# Patient Record
Sex: Female | Born: 2000 | Hispanic: Yes | Smoking: Former smoker
Health system: Southern US, Community
[De-identification: ages and names within clinical notes are randomized; demographics above are authoritative.]

## PROBLEM LIST (undated history)

## (undated) DIAGNOSIS — O039 Complete or unspecified spontaneous abortion without complication: Secondary | ICD-10-CM

## (undated) DIAGNOSIS — R0602 Shortness of breath: Secondary | ICD-10-CM

## (undated) DIAGNOSIS — D649 Anemia, unspecified: Secondary | ICD-10-CM

## (undated) HISTORY — DX: Anemia, unspecified: D64.9

## (undated) HISTORY — DX: Complete or unspecified spontaneous abortion without complication: O03.9

## (undated) HISTORY — DX: Shortness of breath: R06.02

## (undated) HISTORY — PX: NO PAST SURGERIES: SHX2092

## (undated) HISTORY — PX: APPENDECTOMY: SHX54

---

## 2020-10-11 NOTE — L&D Delivery Note (Signed)
Vaginal Delivery Note  Spontaneous delivery of live viable female infant from the OA position through an intact perineum. Delivery of anterior right shoulder with gentle downward guidance followed by delivery of the left posterior shoulder with gentle upward guidance. Body followed spontaneously. Infant placed on maternal chest. Nursery present and helped with neonatal resuscitation and evaluation. Cord clamped and cut after one minute. Cord blood collected. Placenta delivered spontaneously and intact with a 3 vessel cord.  Second degree perineal laceration. Uterus firm and below umbilicus at the end of the delivery.  Mom and baby recovering in stable condition. Sponge and needle counts were correct at the end of the delivery. Methergine and cytotec were given for bleeding.   APGARS: 1 minute:7 5 minutes: 9 Weight: pending Epidural present Laceration: Second degree perineal / right labial  Adelene Idler MD [facility], [facility] 06/23/21 11:37 PM

## 2020-10-16 ENCOUNTER — Emergency Department: Payer: Medicaid Other

## 2020-10-16 ENCOUNTER — Encounter: Payer: Self-pay | Admitting: Emergency Medicine

## 2020-10-16 ENCOUNTER — Other Ambulatory Visit: Payer: Self-pay

## 2020-10-16 ENCOUNTER — Emergency Department
Admission: EM | Admit: 2020-10-16 | Discharge: 2020-10-16 | Disposition: A | Discharge status: Home / Self Care | Payer: Medicaid Other | Attending: Emergency Medicine | Admitting: Emergency Medicine

## 2020-10-16 DIAGNOSIS — R1030 Lower abdominal pain, unspecified: Secondary | ICD-10-CM | POA: Insufficient documentation

## 2020-10-16 DIAGNOSIS — Z3491 Encounter for supervision of normal pregnancy, unspecified, first trimester: Secondary | ICD-10-CM

## 2020-10-16 DIAGNOSIS — O26891 Other specified pregnancy related conditions, first trimester: Secondary | ICD-10-CM | POA: Insufficient documentation

## 2020-10-16 DIAGNOSIS — Z3A01 Less than 8 weeks gestation of pregnancy: Secondary | ICD-10-CM | POA: Diagnosis not present

## 2020-10-16 LAB — ABO/RH: ABO/RH(D): A POS

## 2020-10-16 LAB — URINALYSIS, COMPLETE (UACMP) WITH MICROSCOPIC
Bacteria, UA: NONE SEEN
Bilirubin Urine: NEGATIVE
Glucose, UA: NEGATIVE mg/dL
Hgb urine dipstick: NEGATIVE
Ketones, ur: NEGATIVE mg/dL
Leukocytes,Ua: NEGATIVE
Nitrite: NEGATIVE
Protein, ur: NEGATIVE mg/dL
Specific Gravity, Urine: 1.014 (ref 1.005–1.030)
pH: 7 (ref 5.0–8.0)

## 2020-10-16 LAB — HCG, QUANTITATIVE, PREGNANCY: hCG, Beta Chain, Quant, S: 5167 m[IU]/mL — ABNORMAL HIGH

## 2020-10-16 MED ORDER — ACETAMINOPHEN 500 MG PO TABS
1000.0000 mg | ORAL_TABLET | ORAL | Status: AC
  Administered 2020-10-16: 1000 mg via ORAL
  Filled 2020-10-16: qty 2

## 2020-10-16 NOTE — ED Notes (Signed)
Interpreter requested 

## 2020-10-16 NOTE — ED Notes (Signed)
Pt taken to US

## 2020-10-16 NOTE — ED Triage Notes (Signed)
Pt to ED via POV with c/o possible pregnancy. Pt states having lower abdominal cramping, denies vaginal bleeding, pt states +home pregnancy test 2 days ago.

## 2020-10-16 NOTE — ED Provider Notes (Signed)
[facility] Emergency Department Provider Note   ____________________________________________   Event Date/Time   First MD Initiated Contact with Patient 10/16/20 1315     (approximate)  I have reviewed the triage vital signs and the nursing notes.   HISTORY  Chief Complaint Possible Pregnancy  Spanish interpreter Annice Pih utilized  HPI Nicole Rocha is a 20 y.o. female   here for evaluation for possible pregnancy  Patient reports she has had slight to moderate lower abdominal crampy discomfort for about 2 days.  She is also noticed over the last few days that she has had breast tenderness.  Denies vaginal bleeding or discharge.  Has not taken anything to alleviate symptoms.  Took a home pregnancy test that was positive  No fevers or chills.  No nausea vomiting.  No chest pain or trouble breathing.  Denies pain or burning with urination.  If pregnant this would be her first pregnancy   History reviewed. No pertinent past medical history.  There are no problems to display for this patient.   History reviewed. No pertinent surgical history.  Prior to Admission medications   Not on File  Takes no medications no allergies to any medications  Allergies Patient has no known allergies.  No family history on file.  Social History Social History   Tobacco Use  . Smoking status: Never Smoker  . Smokeless tobacco: Never Used  Substance Use Topics  . Alcohol use: Yes    Comment: occ  . Drug use: Not Currently    Review of Systems Constitutional: No fever/chills ENT: No sore throat. Cardiovascular: Denies chest pain. Respiratory: Denies shortness of breath. Gastrointestinal: Crampy lower abdominal pain.  Also associated to loose stools today Genitourinary: Negative for dysuria. Musculoskeletal: Negative for back pain. Skin: Negative for rash. Neurological: Negative for headaches, areas of focal weakness or  numbness.    ____________________________________________   PHYSICAL EXAM:  VITAL SIGNS: ED Triage Vitals  Enc Vitals Group     BP 10/16/20 1252 109/67     Pulse Rate 10/16/20 1252 77     Resp 10/16/20 1252 18     Temp 10/16/20 1252 98.8 F (37.1 C)     Temp Source 10/16/20 1252 Oral     SpO2 10/16/20 1252 100 %     Weight 10/16/20 1253 160 lb (72.6 kg)     Height 10/16/20 1253 5\' 6"  (1.676 m)     Head Circumference --      Peak Flow --      Pain Score 10/16/20 1253 5     Pain Loc --      Pain Edu? --      Excl. in GC? --     Constitutional: Alert and oriented. Well appearing and in no acute distress. Eyes: Conjunctivae are normal. Head: Atraumatic. Nose: No congestion/rhinnorhea. Mouth/Throat: Mucous membranes are moist. Neck: No stridor.  Cardiovascular: Normal rate, regular rhythm. Grossly normal heart sounds.  Good peripheral circulation. Respiratory: Normal respiratory effort.  No retractions. Lungs CTAB. Gastrointestinal: Soft and mild tenderness primarily in the suprapubic region.  No focal pain at McBurney's point.  Negative Murphy.  Reports pain is about a 6 out of 10 when examined more in the lower abdomen.  No rebound or guarding. overall reassuring abdominal examination. Musculoskeletal: No lower extremity tenderness nor edema. Neurologic:  Normal speech and language. No gross focal neurologic deficits are appreciated.  Ambulates with normal gait without difficulty. Skin:  Skin is warm, dry and intact. No rash  noted. Psychiatric: Mood and affect are normal. Speech and behavior are normal.  ____________________________________________   LABS (all labs ordered are listed, but only abnormal results are displayed)  Labs Reviewed  HCG, QUANTITATIVE, PREGNANCY - Abnormal; Notable for the following components:      Result Value   hCG, Beta Chain, Quant, S 5,167 (*)    All other components within normal limits  URINALYSIS, COMPLETE (UACMP) WITH MICROSCOPIC -  Abnormal; Notable for the following components:   Color, Urine YELLOW (*)    APPearance CLEAR (*)    All other components within normal limits  URINE CULTURE  ABO/RH   ____________________________________________  EKG   ____________________________________________  RADIOLOGY  IMPRESSION: Probable early intrauterine gestational sac and yolk sac, but no fetal pole, or cardiac activity yet visualized. Recommend follow-up quantitative B-HCG levels and follow-up US in 14 days to assess viability. This recommendation follows SRU consensus guidelines: Diagnostic Criteria for Nonviable Pregnancy Early in the First Trimester. Malva Limes Med 2013; 081:4481-85.  Ultrasound results reviewed by me, probable early intrauterine gestational sac ____________________________________________   PROCEDURES  Procedure(s) performed: None  Procedures  Critical Care performed: No  ____________________________________________   INITIAL IMPRESSION / ASSESSMENT AND PLAN / ED COURSE  Pertinent labs & imaging results that were available during my care of the patient were reviewed by me and considered in my medical decision making (see chart for details).   Patient with positive hCG greater than 5000.  Given her symptoms associated breast tenderness and positive pregnancy test will evaluate with ultrasound and urinalysis.  Denies acute gynecologic infectious symptoms.  Her exam is nontoxic.  At this point with her reassuring exam afebrile status and nonfocality of pain in the area of McBurney's point or right upper quadrant I do not feel that imaging such as CT imaging of the abdomen would be warranted.  Reassuring examination and nontoxic appearance.  ----------------------------------------- 3:33 PM on 10/16/2020 -----------------------------------------  Discussed test results and recommendations for follow-up with patient via Spanish interpreter.  Discussed careful return precautions.  Patient  does report she is vacationing in the area, so we did discuss very careful return precautions as well as a follow-up option for her to follow-up with [facility].  Patient understands that she is pregnant and will start taking prenatal vitamin as well daily and set up follow-up with obstetrician        ____________________________________________   FINAL CLINICAL IMPRESSION(S) / ED DIAGNOSES  Final diagnoses:  First trimester pregnancy        Note:  This document was prepared using Dragon voice recognition software and may include unintentional dictation errors       Sharyn Creamer, MD 10/16/20 1534

## 2020-10-18 LAB — URINE CULTURE: Culture: <10000 — AB

## 2020-11-15 ENCOUNTER — Encounter: Payer: Self-pay | Admitting: Emergency Medicine

## 2020-11-15 DIAGNOSIS — O26891 Other specified pregnancy related conditions, first trimester: Secondary | ICD-10-CM | POA: Diagnosis present

## 2020-11-15 DIAGNOSIS — Z3A09 9 weeks gestation of pregnancy: Secondary | ICD-10-CM | POA: Diagnosis not present

## 2020-11-15 DIAGNOSIS — O23591 Infection of other part of genital tract in pregnancy, first trimester: Secondary | ICD-10-CM | POA: Insufficient documentation

## 2020-11-15 DIAGNOSIS — B9689 Other specified bacterial agents as the cause of diseases classified elsewhere: Secondary | ICD-10-CM | POA: Diagnosis not present

## 2020-11-15 LAB — COMPREHENSIVE METABOLIC PANEL
ALT: 18 U/L (ref 0–44)
AST: 22 U/L (ref 15–41)
Albumin: 4.5 g/dL (ref 3.5–5.0)
Alkaline Phosphatase: 50 U/L (ref 38–126)
Anion gap: 11 (ref 5–15)
BUN: 15 mg/dL (ref 6–20)
CO2: 21 mmol/L — ABNORMAL LOW (ref 22–32)
Calcium: 9.5 mg/dL (ref 8.9–10.3)
Chloride: 104 mmol/L (ref 98–111)
Creatinine, Ser: 0.37 mg/dL — ABNORMAL LOW (ref 0.44–1.00)
GFR, Estimated: >60 mL/min (ref >60)
Glucose, Bld: 117 mg/dL — ABNORMAL HIGH (ref 70–99)
Potassium: 3.4 mmol/L — ABNORMAL LOW (ref 3.5–5.1)
Sodium: 136 mmol/L (ref 135–145)
Total Bilirubin: 0.3 mg/dL (ref 0.3–1.2)
Total Protein: 7.7 g/dL (ref 6.5–8.1)

## 2020-11-15 LAB — CBC
HCT: 33.6 % — ABNORMAL LOW (ref 36.0–46.0)
Hemoglobin: 11.7 g/dL — ABNORMAL LOW (ref 12.0–15.0)
MCH: 28.6 pg (ref 26.0–34.0)
MCHC: 34.8 g/dL (ref 30.0–36.0)
MCV: 82.2 fL (ref 80.0–100.0)
Platelets: 280 10*3/uL (ref 150–400)
RBC: 4.09 MIL/uL (ref 3.87–5.11)
RDW: 12.7 % (ref 11.5–15.5)
WBC: 8.8 10*3/uL (ref 4.0–10.5)
nRBC: 0 % (ref 0.0–0.2)

## 2020-11-15 LAB — URINALYSIS, COMPLETE (UACMP) WITH MICROSCOPIC
Bacteria, UA: NONE SEEN
Bilirubin Urine: NEGATIVE
Glucose, UA: NEGATIVE mg/dL
Hgb urine dipstick: NEGATIVE
Ketones, ur: NEGATIVE mg/dL
Leukocytes,Ua: NEGATIVE
Nitrite: NEGATIVE
Protein, ur: NEGATIVE mg/dL
Specific Gravity, Urine: 1.024 (ref 1.005–1.030)
pH: 5 (ref 5.0–8.0)

## 2020-11-15 LAB — LIPASE, BLOOD: Lipase: 36 U/L (ref 11–51)

## 2020-11-15 NOTE — ED Triage Notes (Signed)
Interpreter [phone]  Pt reports she is [redacted] weeks pregnant and started to have lower abdominal cramping. Pt denies vaginal bleeding. Pt has been pregnant x2 with 1 miscarriage.

## 2020-11-16 ENCOUNTER — Emergency Department
Admission: EM | Admit: 2020-11-16 | Discharge: 2020-11-16 | Disposition: A | Discharge status: Home / Self Care | Payer: Medicaid Other | Attending: Emergency Medicine | Admitting: Emergency Medicine

## 2020-11-16 ENCOUNTER — Emergency Department: Payer: Medicaid Other

## 2020-11-16 DIAGNOSIS — O26899 Other specified pregnancy related conditions, unspecified trimester: Secondary | ICD-10-CM

## 2020-11-16 DIAGNOSIS — R102 Pelvic and perineal pain: Secondary | ICD-10-CM

## 2020-11-16 DIAGNOSIS — B9689 Other specified bacterial agents as the cause of diseases classified elsewhere: Secondary | ICD-10-CM

## 2020-11-16 LAB — CHLAMYDIA/NGC RT PCR (ARMC ONLY)
Chlamydia Tr: NOT DETECTED
N gonorrhoeae: NOT DETECTED

## 2020-11-16 LAB — WET PREP, GENITAL
Sperm: NONE SEEN
Trich, Wet Prep: NONE SEEN
WBC, Wet Prep HPF POC: NONE SEEN
Yeast Wet Prep HPF POC: NONE SEEN

## 2020-11-16 LAB — HCG, QUANTITATIVE, PREGNANCY: hCG, Beta Chain, Quant, S: 230959 m[IU]/mL — ABNORMAL HIGH (ref <5)

## 2020-11-16 MED ORDER — METRONIDAZOLE 0.75 % VA GEL: 1.0000 | Freq: Two times a day (BID) | VAGINAL | 0 refills | Status: AC

## 2020-11-16 MED ORDER — ACETAMINOPHEN 500 MG PO TABS
1000.0000 mg | ORAL_TABLET | Freq: Once | ORAL | Status: AC
  Administered 2020-11-16: 1000 mg via ORAL
  Filled 2020-11-16: qty 2

## 2020-11-16 MED ORDER — METOCLOPRAMIDE HCL 10 MG PO TABS: 10.0000 mg | ORAL_TABLET | Freq: Three times a day (TID) | ORAL | 1 refills | Status: DC | PRN

## 2020-11-16 NOTE — ED Provider Notes (Addendum)
[facility] Emergency Department Provider Note  ____________________________________________   Event Date/Time   First MD Initiated Contact with Patient 11/16/20 0112     (approximate)  I have reviewed the triage vital signs and the nursing notes.   HISTORY  Chief Complaint Abdominal Pain    HPI Nicole Rocha is a 20 y.o. female G2P0 with h/o miscarriage who presents to the ED with complaints of lower abdominal pain since 6pm that feels like colic.  No A/A factors.  No medications taken prior to arrival.  No fever, chills.  Has had nausea, vomiting throughout pregnancy. Last emesis today. Intermittent diarrhea.  Last diarrhea.  No dysuria, hematuria, vaginal bleeding, no leaking fluid.  Has had foul smelling vaginal discharge intermittently for 3-4 months.  No h/o STDs.  Sexually active with one partner.  Has not seen OBGYN yet.  Has first appt with OB 2/16.  Can't remember name of OBGYN. LMP was 09/04/20.   Interpreter used throughout visit.        History reviewed. No pertinent past medical history.  There are no problems to display for this patient.   History reviewed. No pertinent surgical history.  Prior to Admission medications   Medication Sig Start Date End Date Taking? Authorizing Provider  metoCLOPramide (REGLAN) 10 MG tablet Take 1 tablet (10 mg total) by mouth every 8 (eight) hours as needed for nausea. 11/16/20 11/16/21 Yes Delila Kuklinski N, DO  metroNIDAZOLE (METROGEL VAGINAL) 0.75 % vaginal gel Place 1 Applicatorful vaginally 2 (two) times daily for 7 days. 11/16/20 11/23/20 Yes Shelisa Fern, Layla Maw, DO    Allergies Patient has no known allergies.  History reviewed. No pertinent family history.  Social History Social History   Tobacco Use  . Smoking status: Never Smoker  . Smokeless tobacco: Never Used  Substance Use Topics  . Alcohol use: Yes    Comment: occ  . Drug use: Not Currently    Review of Systems Constitutional:  No fever. Eyes: No visual changes. ENT: No sore throat. Cardiovascular: Denies chest pain. Respiratory: Denies shortness of breath. Gastrointestinal: + nausea, vomiting, diarrhea. Genitourinary: Negative for dysuria. Musculoskeletal: Negative for back pain. Skin: Negative for rash. Neurological: Negative for focal weakness or numbness.  ____________________________________________   PHYSICAL EXAM:  VITAL SIGNS: ED Triage Vitals [11/15/20 2323]  Enc Vitals Group     BP 129/73     Pulse Rate 94     Resp 17     Temp 98.7 F (37.1 C)     Temp Source Oral     SpO2 99 %     Weight      Height      Head Circumference      Peak Flow      Pain Score      Pain Loc      Pain Edu?      Excl. in GC?    CONSTITUTIONAL: Alert and oriented and responds appropriately to questions. Well-appearing; well-nourished HEAD: Normocephalic EYES: Conjunctivae clear, pupils appear equal, EOM appear intact ENT: normal nose; moist mucous membranes NECK: Supple, normal ROM CARD: RRR; S1 and S2 appreciated; no murmurs, no clicks, no rubs, no gallops RESP: Normal chest excursion without splinting or tachypnea; breath sounds clear and equal bilaterally; no wheezes, no rhonchi, no rales, no hypoxia or respiratory distress, speaking full sentences ABD/GI: Normal bowel sounds; non-distended; soft, mild suprapubic tenderness, no rebound, no guarding, no peritoneal signs, no hepatosplenomegaly GU:  Normal external genitalia. No lesions, rashes noted.  Patient has no vaginal bleeding on exam.  Minimal thin white vaginal discharge.  No adnexal tenderness, mass or fullness, no cervical motion tenderness. Cervix is not appear friable.  Cervix is closed.  Chaperone present for exam. BACK: The back appears normal EXT: Normal ROM in all joints; no deformity noted, no edema; no cyanosis SKIN: Normal color for age and race; warm; no rash on exposed skin NEURO: Moves all extremities equally PSYCH: The patient's mood  and manner are appropriate.  ____________________________________________   LABS (all labs ordered are listed, but only abnormal results are displayed)  Labs Reviewed  WET PREP, GENITAL - Abnormal; Notable for the following components:      Result Value   Clue Cells Wet Prep HPF POC PRESENT (*)    All other components within normal limits  COMPREHENSIVE METABOLIC PANEL - Abnormal; Notable for the following components:   Potassium 3.4 (*)    CO2 21 (*)    Glucose, Bld 117 (*)    Creatinine, Ser 0.37 (*)    All other components within normal limits  CBC - Abnormal; Notable for the following components:   Hemoglobin 11.7 (*)    HCT 33.6 (*)    All other components within normal limits  URINALYSIS, COMPLETE (UACMP) WITH MICROSCOPIC - Abnormal; Notable for the following components:   Color, Urine YELLOW (*)    APPearance HAZY (*)    All other components within normal limits  HCG, QUANTITATIVE, PREGNANCY - Abnormal; Notable for the following components:   hCG, Beta Chain, Quant, S 230,959 (*)    All other components within normal limits  CHLAMYDIA/NGC RT PCR ([facility] ONLY)  LIPASE, BLOOD   ____________________________________________  EKG  none ____________________________________________  RADIOLOGY I, Demetre Monaco, personally viewed and evaluated these images (plain radiographs) as part of my medical decision making, as well as reviewing the written report by the radiologist.  ED MD interpretation:  Normal IUP with normal FHR  Official radiology report(s): US OB Comp Less 14 Wks  Result Date: 11/16/2020 CLINICAL DATA:  Cramping. Beta HCG quantitative 230,959 (from 5,167 on 10/16/20). Last menstrual period 09/04/2020. Gestational age by last menstrual period 10 weeks and 3 days. Due date by last menstrual period 06/11/2021. EXAM: OBSTETRIC <14 WK [place] AND TRANSVAGINAL OB US TECHNIQUE: Both transabdominal and transvaginal ultrasound examinations were performed for complete  evaluation of the gestation as well as the maternal uterus, adnexal regions, and pelvic cul-de-sac. Transvaginal technique was performed to assess early pregnancy. COMPARISON:  Ultrasound Ob 10/16/2020 FINDINGS: Intrauterine gestational sac: Single Yolk sac:  Visualized. Embryo:  Visualized. Cardiac Activity: Visualized. Heart Rate: 176 bpm CRL:  27.3 mm   9 w   4 d                  [place] EDC: 06/17/2021 Subchorionic hemorrhage:  None visualized. Maternal uterus/adnexae: The right ovary is unremarkable. The right ovary measures 4.1 x 1.7 x 1.7 cm. The left ovary is unremarkable and measures 3.1 x 1.8 x 2.1 cm. Corpus luteum cyst noted within the left ovary. Other: No free pelvic fluid. IMPRESSION: Single live intrauterine pregnancy with gestational age of [redacted] weeks and 4 days that is concordant with gestational age by last menstrual period of 10 weeks and 3 days. Electronically Signed   By: Tish Frederickson M.D.   On: 11/16/2020 00:44    ____________________________________________   PROCEDURES  Procedure(s) performed (including Critical Care):   ____________________________________________   INITIAL IMPRESSION / ASSESSMENT AND PLAN / ED COURSE  As part of my medical decision making, I reviewed the following data within the electronic MEDICAL RECORD NUMBER Nursing notes reviewed and incorporated, Interpreter needed, Labs reviewed, Old chart reviewed and Notes from prior ED visits         Patient here with lower abdominal pain in the setting of pregnancy.  Reports intermittent foul-smelling discharge.  Will perform pelvic exam with cultures.  No right lower quadrant tenderness, fever, leukocytosis.  Low suspicion for appendicitis, colitis, diverticulitis, kidney stone, pyelonephritis.  Urine does not appear infected here and there is no protein, ketones.  Abdominal ultrasound obtained shows single live intrauterine pregnancy measuring approximately 9 weeks and 4 days with fetal heart rate of 176.  She  denies any vaginal bleeding.  She has follow-up with OB/GYN scheduled in 10 days.  Will give Tylenol for discomfort and reassess.     Prep Positive for clue cells.  Will discharge on MetroGel.  Gonorrhea and Chlamydia negative.  Will discharge home.  She has OB/GYN follow-up scheduled.  Recommended increase fluid intake and Tylenol over-the-counter for discomfort.  She is comfortable with this plan.  Able to tolerate po here.  Reports feeling better.  Patient request prescription for nausea medicine.  Will discharge with Reglan.   At this time, I do not feel there is any life-threatening condition present. I have reviewed, interpreted and discussed all results (EKG, imaging, lab, urine as appropriate) and exam findings with patient/family. I have reviewed nursing notes and appropriate previous records.  I feel the patient is safe to be discharged home without further emergent workup and can continue workup as an outpatient as needed. Discussed usual and customary return precautions. Patient/family verbalize understanding and are comfortable with this plan.  Outpatient follow-up has been provided as needed. All questions have been answered.  ____________________________________________   FINAL CLINICAL IMPRESSION(S) / ED DIAGNOSES  Final diagnoses:  Pelvic pain in pregnancy  Bacterial vaginosis in pregnancy     ED Discharge Orders         Ordered    metroNIDAZOLE (METROGEL VAGINAL) 0.75 % vaginal gel  2 times daily        11/16/20 0341    metoCLOPramide (REGLAN) 10 MG tablet  Every 8 hours PRN        11/16/20 0400          *Please note:  Nicole Rocha was evaluated in Emergency Department on 11/16/2020 for the symptoms described in the history of present illness. She was evaluated in the context of the global COVID-19 pandemic, which necessitated consideration that the patient might be at risk for infection with the SARS-CoV-2 virus that causes COVID-19. Institutional protocols  and algorithms that pertain to the evaluation of patients at risk for COVID-19 are in a state of rapid change based on information released by regulatory bodies including the CDC and federal and state organizations. These policies and algorithms were followed during the patient's care in the ED.  Some ED evaluations and interventions may be delayed as a result of limited staffing during and the pandemic.*   Note:  This document was prepared using Dragon voice recognition software and may include unintentional dictation errors.       Akane Tessier, Layla Maw, DO 11/16/20 0400

## 2020-11-16 NOTE — Discharge Instructions (Addendum)
Please follow-up with your OB/GYN as scheduled on the 16th.  You may take over-the-counter Tylenol 1000 mg every 6 hours as needed for pain.  Please increase your fluid intake.  Your pelvic swabs did show clue cells and we are starting you on antibiotics for bacterial vaginosis.  No other sign of infection noted.  This is not a sexually transmitted disease.  Otherwise your labs and urine were reassuring.  Ultrasound showed a normal-appearing pregnancy.

## 2020-11-16 NOTE — ED Notes (Signed)
Pt discharged with assist of Lavonda Jumbo interpreter [phone] with [facility] interpreter services.

## 2020-11-26 ENCOUNTER — Ambulatory Visit: Payer: Medicaid Other | Admitting: Family Medicine

## 2020-11-26 ENCOUNTER — Encounter: Payer: Self-pay | Admitting: Family Medicine

## 2020-11-26 ENCOUNTER — Other Ambulatory Visit: Payer: Self-pay

## 2020-11-26 VITALS — BP 103/68 | HR 84 | Temp 97.7°F | Wt 178.6 lb

## 2020-11-26 DIAGNOSIS — Z3480 Encounter for supervision of other normal pregnancy, unspecified trimester: Secondary | ICD-10-CM | POA: Insufficient documentation

## 2020-11-26 DIAGNOSIS — Z3481 Encounter for supervision of other normal pregnancy, first trimester: Secondary | ICD-10-CM | POA: Diagnosis not present

## 2020-11-26 DIAGNOSIS — Z01419 Encounter for gynecological examination (general) (routine) without abnormal findings: Secondary | ICD-10-CM

## 2020-11-26 DIAGNOSIS — Z1272 Encounter for screening for malignant neoplasm of vagina: Secondary | ICD-10-CM

## 2020-11-26 HISTORY — DX: Encounter for supervision of other normal pregnancy, unspecified trimester: Z34.80

## 2020-11-26 LAB — URINALYSIS
Bilirubin, UA: NEGATIVE
Glucose, UA: NEGATIVE
Ketones, UA: NEGATIVE
Leukocytes,UA: NEGATIVE
Nitrite, UA: NEGATIVE
Protein,UA: NEGATIVE
Specific Gravity, UA: 1.03 (ref 1.005–1.030)
Urobilinogen, Ur: 0.2 mg/dL (ref 0.2–1.0)
pH, UA: 6 (ref 5.0–7.5)

## 2020-11-26 LAB — WET PREP FOR TRICH, YEAST, CLUE
Trichomonas Exam: NEGATIVE
Yeast Exam: NEGATIVE

## 2020-11-26 LAB — HEMOGLOBIN, FINGERSTICK: Hemoglobin: 11.4 g/dL (ref 11.1–15.9)

## 2020-11-26 LAB — OB RESULTS CONSOLE VARICELLA ZOSTER ANTIBODY, IGG: Varicella: IMMUNE

## 2020-11-26 NOTE — Progress Notes (Addendum)
Here today for MH IP ~ 11.6 weeks. Taking PNV QD. Has been to [facility] ED on 10/16/2020 and 11/16/2020. Declines FLU and Covid vaccines today. Tawny Hopping, RN

## 2020-11-26 NOTE — Progress Notes (Signed)
[facility] HEALTH DEPT [facility] [address] [place] [address] [phone]  INITIAL PRENATAL VISIT NOTE  Subjective:  Nicole Rocha is a 20 y.o. G2P0010 at [redacted]w[redacted]d being seen today to start prenatal care at the [facility] Department.  She is currently monitored for the following issues for this low-risk pregnancy and has Supervision of other normal pregnancy, antepartum on their problem list.  Patient reports nausea.  Contractions: Not present. Vag. Bleeding: None.  Movement: Absent. Denies leaking of fluid.   Indications for ASA therapy (per uptodate) One of the following: Previous pregnancy with preeclampsia, especially early onset and with an adverse outcome No Multifetal gestation No Chronic hypertension No Type 1 or 2 diabetes mellitus No Chronic kidney disease No Autoimmune disease (antiphospholipid syndrome, systemic lupus erythematosus) No  Two or more of the following: Nulliparity No Obesity (body mass index >30 kg/m2) No Family history of preeclampsia in mother or sister No Age ?35 years No Sociodemographic characteristics (African American race, low socioeconomic level) Yes Personal risk factors (eg, previous pregnancy with low birth weight or small for gestational age infant, previous adverse pregnancy outcome [eg, stillbirth], interval >10 years between pregnancies) No   The following portions of the patient's history were reviewed and updated as appropriate: allergies, current medications, past family history, past medical history, past social history, past surgical history and problem list. Problem list updated.  Objective:   Vitals:   11/26/20 1328  BP: 103/68  Pulse: 84  Temp: 97.7 F (36.5 C)  Weight: 178 lb 9.6 oz (81 kg)    Fetal Status:   Fundal Height: 11 cm Movement: Absent  Presentation: Undeterminable   Physical Exam Vitals and nursing note reviewed.  Constitutional:       General: She is not in acute distress.    Appearance: Normal appearance. She is well-developed.  HENT:     Head: Normocephalic and atraumatic.     Right Ear: External ear normal.     Left Ear: External ear normal.     Nose: Nose normal. No congestion or rhinorrhea.     Mouth/Throat:     Lips: Pink.     Mouth: Mucous membranes are moist.     Dentition: Normal dentition. No dental caries.     Pharynx: Oropharynx is clear. Uvula midline.  Eyes:     General: No scleral icterus.    Conjunctiva/sclera: Conjunctivae normal.  Neck:     Thyroid: No thyroid mass or thyromegaly.  Cardiovascular:     Rate and Rhythm: Normal rate.     Pulses: Normal pulses.     Comments: Extremities are warm and well perfused Pulmonary:     Effort: Pulmonary effort is normal.     Breath sounds: Normal breath sounds.  Chest:     Chest wall: No mass.  Breasts:     Tanner Score is 5. Breasts are symmetrical.     Right: Normal. No mass, nipple discharge, skin change or axillary adenopathy.     Left: Normal. No mass, nipple discharge, skin change or axillary adenopathy.    Abdominal:     General: Abdomen is flat.     Palpations: Abdomen is soft.     Tenderness: There is no abdominal tenderness.     Comments: Gravid   Genitourinary:    General: Normal vulva.     Exam position: Lithotomy position.     Pubic Area: No rash.      Labia:  Right: No rash.        Left: No rash.      Vagina: Normal. No vaginal discharge.     Cervix: No cervical motion tenderness or friability.     Uterus: Normal. Not tender. Enlarged: Gravid 11 size       Adnexa: Right adnexa normal and left adnexa normal.     Rectum: Normal. No external hemorrhoid.  Musculoskeletal:     Right lower leg: No edema.     Left lower leg: No edema.  Lymphadenopathy:     Upper Body:     Right upper body: No axillary adenopathy.     Left upper body: No axillary adenopathy.  Skin:    General: Skin is warm.     Capillary Refill:  Capillary refill takes less than 2 seconds.  Neurological:     Mental Status: She is alert.     Assessment and Plan:  Pregnancy: G2P0010 at [redacted]w[redacted]d  1. Supervision of other normal pregnancy, antepartum 2. Smear, vaginal, as part of routine gynecological examination  Patient in clinic today for initial prenatal visit.  Patient is happy and wants to be pregnant.  Patient has previous SAB @ 16 weeks.  She lives in [place] with FOB.  She is not currently working.    Patient reports nausea mostly in the mornings.  She was given Reglan at her last ER visit on 11/16/2020 and reports that she only took 3 doses.  Patient given informations sheet on methods to help prevent or ease nausea.  She has had 2 ED visits on 10/16/2020 & 11/16/2020.  She denies bleeding or contractions.  Discussed with patient about s/sx of SAB including s/sx that she previously experienced.     Patient reports LMP was  09/04/2020. Patient is taking PNV daily.     She denies allergies, history of chronic medical illnesses, STI's , physical or sexual abuse.   Patient was tested for CT/GC on 11/16/2020, that resulted negative,  Patient denies any symptoms or new partners since testing.  Patient was treated for BV at [facility] on 11/16/2020.  Patient finished last dose of Metronidazole on 11/25/2020.   No previous pap for patient, PAP collected today.  Does not meet criteria for asa therapy nor MNT.  PHQ-9 is 4 and declines counseling.    wants to deliver with [facility].    Last ETOH used was 2 months ago, denies any tobacco or illegal substances used.     Last dentist: > 3-4 years ago   Hx preterm birth? (want 17p?)  SAB @ 3-4 months   Fhx genetic abn?  None  Discuss genetic tests: yes   Delivery group is [facility]   Covid vaccine: declines   Discussed overview of care and coordination with inpatient delivery practices including [facility], [facility], [facility] and [facility].     Preterm labor symptoms and general obstetric precautions  including but not limited to vaginal bleeding, contractions, leaking of fluid and fetal movement were reviewed in detail with the patient.  Please refer to After Visit Summary for other counseling recommendations.   Return in about 4 weeks (around 12/24/2020) for routine prenatal care.  Future Appointments  Date Time Provider Department Center  12/04/2020  9:00 AM [facility] GENETIC [facility] None   Spanish interpreter M. Yemen used  Wendi Snipes, [place]

## 2020-11-26 NOTE — Progress Notes (Signed)
Wet Prep results reviewed. Per standing orders no treatment indicated. Tawny Hopping, RN

## 2020-11-27 ENCOUNTER — Telehealth: Payer: Self-pay

## 2020-11-27 LAB — LEAD, BLOOD (ADULT >= 16 YRS): Lead-Whole Blood: <1 ug/dL (ref 0–4)

## 2020-11-27 NOTE — Telephone Encounter (Signed)
Per Epic appt desk, client has 12/04/20 0900 [facility] genetic counseling appt (has prior US). Call to client with Marlene Yemen ([facility] interpreter) to verify client aware of appt. Phone number not working and note on pink sticky to verify phone number. Left message on emergency contact number ([phone]) requesting assistance in contacting client to call [facility] and number provided. Jossie Ng, RN

## 2020-11-27 NOTE — Telephone Encounter (Signed)
Call received from client who was given genetic counseling appt information and directions to facility. Emmaline Kluver interpreted during call. Jossie Ng, RN

## 2020-11-28 LAB — 789231 7+OXYCODONE-BUND
Amphetamines, Urine: NEGATIVE ng/mL
BENZODIAZ UR QL: NEGATIVE ng/mL
Barbiturate screen, urine: NEGATIVE ng/mL
Cannabinoid Quant, Ur: NEGATIVE ng/mL
Cocaine (Metab.): NEGATIVE ng/mL
OPIATE SCREEN URINE: NEGATIVE ng/mL
Oxycodone/Oxymorphone, Urine: NEGATIVE ng/mL
PCP Quant, Ur: NEGATIVE ng/mL

## 2020-11-28 LAB — HGB FRACTIONATION CASCADE
Hgb A2: 2.7 % (ref 1.8–3.2)
Hgb A: 97.3 % (ref 96.4–98.8)
Hgb F: 0 % (ref 0.0–2.0)
Hgb S: 0 %

## 2020-11-28 LAB — HCV AB W REFLEX TO QUANT PCR: HCV Ab: 0.1 s/co ratio (ref 0.0–0.9)

## 2020-11-28 LAB — URINE CULTURE

## 2020-11-28 LAB — HCV INTERPRETATION

## 2020-11-29 LAB — CBC/D/PLT+RPR+RH+ABO+RUB AB...
Antibody Screen: NEGATIVE
Basophils Absolute: 0 10*3/uL (ref 0.0–0.2)
Basos: 0 %
EOS (ABSOLUTE): 0 10*3/uL (ref 0.0–0.4)
Eos: 0 %
Hematocrit: 34.2 % (ref 34.0–46.6)
Hemoglobin: 11.6 g/dL (ref 11.1–15.9)
Hepatitis B Surface Ag: NEGATIVE
Immature Grans (Abs): 0 10*3/uL (ref 0.0–0.1)
Immature Granulocytes: 0 %
Lymphocytes Absolute: 1.4 10*3/uL (ref 0.7–3.1)
Lymphs: 18 %
MCH: 29.1 pg (ref 26.6–33.0)
MCHC: 33.9 g/dL (ref 31.5–35.7)
MCV: 86 fL (ref 79–97)
Monocytes Absolute: 0.3 10*3/uL (ref 0.1–0.9)
Monocytes: 4 %
Neutrophils Absolute: 5.7 10*3/uL (ref 1.4–7.0)
Neutrophils: 78 %
Platelets: 283 10*3/uL (ref 150–450)
RBC: 3.99 x10E6/uL (ref 3.77–5.28)
RDW: 13.8 % (ref 11.7–15.4)
RPR Ser Ql: NONREACTIVE
Rh Factor: POSITIVE
Rubella Antibodies, IGG: 7.47 index (ref >0.99)
Varicella zoster IgG: 282 index (ref >165)
WBC: 7.5 10*3/uL (ref 3.4–10.8)

## 2020-11-29 LAB — QUANTIFERON-TB GOLD PLUS
QuantiFERON Mitogen Value: >10 IU/mL
QuantiFERON Nil Value: 0.05 IU/mL
QuantiFERON TB1 Ag Value: 0.04 IU/mL
QuantiFERON TB2 Ag Value: 0.05 IU/mL
QuantiFERON-TB Gold Plus: NEGATIVE

## 2020-11-29 LAB — HIV-1/HIV-2 QUALITATIVE RNA
HIV-1 RNA, Qualitative: NONREACTIVE
HIV-2 RNA, Qualitative: NONREACTIVE

## 2020-12-01 LAB — PAP IG (IMAGE GUIDED): PAP Smear Comment: 0

## 2020-12-04 ENCOUNTER — Ambulatory Visit: Payer: Medicaid Other | Attending: Maternal & Fetal Medicine

## 2020-12-04 ENCOUNTER — Other Ambulatory Visit: Payer: Self-pay

## 2020-12-04 DIAGNOSIS — Z818 Family history of other mental and behavioral disorders: Secondary | ICD-10-CM

## 2020-12-04 DIAGNOSIS — Z36 Encounter for antenatal screening for chromosomal anomalies: Secondary | ICD-10-CM

## 2020-12-04 NOTE — Progress Notes (Signed)
Referring physician: [facility] Department Length of Consultation: 45 minutes   Nicole Rocha  was referred to [facility]aternal [facility] at [facility] for genetic counseling to review prenatal screening and testing options.  This note summarizes the information we discussed.    We offered the following routine screening tests for this pregnancy:  Cell free fetal DNA testing from maternal blood may be used to determine whether a baby is at high risk for Down syndrome, trisomy 32, or trisomy 68.  This test utilizes a maternal blood sample and DNA sequencing technology to isolate circulating cell free fetal DNA from maternal plasma.  The fetal DNA can then be analyzed for DNA sequences that are derived from the three most common chromosomes involved in aneuploidy, chromosomes 13, 18, and 21.  If the overall amount of DNA is greater than the expected level for any of these chromosomes, aneuploidy is suspected.  The detection rate for Down syndrome and trisomy 18 is >99% and the detection rate for trisomy 13 is >91%. While we do not consider it a replacement for invasive testing and karyotype analysis, a negative result from this testing would be reassuring, though not a guarantee of a normal chromosome complement for the baby.  An abnormal result is certainly suggestive of an abnormal chromosome complement, though we would still recommend CVS or amniocentesis to confirm any findings from this testing. This testing can also assess for the sex chromosomes and can detect approximately 96% of sex chromosome aneuploidies and determine fetal gender with >99% confidence.    First trimester screening, which includes nuchal translucency ultrasound screen and first trimester maternal serum marker screening.  The nuchal translucency has approximately an 80% detection rate for Down syndrome and can be positive for other chromosome abnormalities as well as congenital heart defects.  When combined with a  maternal serum marker screening, the detection rate is up to 90% for Down syndrome and up to 97% for trisomy 18.     Maternal serum marker screening, a blood test that measures pregnancy proteins, can provide risk assessments for Down syndrome, trisomy 18, and open neural tube defects (spina bifida, anencephaly). Because it does not directly examine the fetus, it cannot positively diagnose or rule out these problems.  Targeted ultrasound uses high frequency sound waves to create an image of the developing fetus.  An ultrasound is often recommended as a routine means of evaluating the pregnancy.  It is also used to screen for fetal anatomy problems (for example, a heart defect) that might be suggestive of a chromosomal or other abnormality.   Should these screening tests indicate an increased concern, then the following additional testing options would be offered:  The chorionic villus sampling procedure is available for first trimester chromosome analysis.  This involves the withdrawal of a small amount of chorionic villi (tissue from the developing placenta).  Risk of pregnancy loss is estimated to be approximately 1 in 200 to 1 in 100 (0.5 to 1%).  There is approximately a 1% (1 in 100) chance that the CVS chromosome results will be unclear.  Chorionic villi cannot be tested for neural tube defects.     Amniocentesis involves the removal of a small amount of amniotic fluid from the sac surrounding the fetus with the use of a thin needle inserted through the maternal abdomen and uterus.  Ultrasound guidance is used throughout the procedure.  Fetal cells from amniotic fluid are directly evaluated and > 99.5% of chromosome problems and >  98% of open neural tube defects can be detected. This procedure is generally performed after the 15th week of pregnancy.  The main risks to this procedure include complications leading to miscarriage in less than 1 in 200 cases (0.5%).  Cystic Fibrosis and Spinal Muscular  Atrophy (SMA) screening were also discussed with the patient. Both conditions are recessive, which means that both parents must be carriers in order to have a child with the disease.  Cystic fibrosis (CF) is one of the most common genetic conditions in persons of Caucasian ancestry.  This condition occurs in approximately 1 in 2,500 Caucasian persons and results in thickened secretions in the lungs, digestive, and reproductive systems.  For a baby to be at risk for having CF, both of the parents must be carriers for this condition.  Approximately 1 in 17 Caucasian persons is a carrier for CF.  Current carrier testing looks for the most common mutations in the gene for CF and can detect approximately 90% of carriers in the Caucasian population.  This means that the carrier screening can greatly reduce, but cannot eliminate, the chance for an individual to have a child with CF.  If an individual is found to be a carrier for CF, then carrier testing would be available for the partner. As part of [place]'s newborn screening profile, all babies born in the state of [place] will have a two-tier screening process.  Specimens are first tested to determine the concentration of immunoreactive trypsinogen (IRT).  The top 5% of specimens with the highest IRT values then undergo DNA testing using a panel of over 40 common CF mutations. SMA is a neurodegenerative disorder that leads to atrophy of skeletal muscle and overall weakness.  This condition is also more prevalent in the Caucasian population, with 1 in 40-1 in 60 persons being a carrier and 1 in 6,000-1 in 10,000 children being affected.  There are multiple forms of the disease, with some causing death in infancy to other forms with survival into adulthood.  The genetics of SMA is complex, but carrier screening can detect up to 95% of carriers in the Caucasian population.  Similar to CF, a negative result can greatly reduce, but cannot eliminate, the chance  to have a child with SMA. Hemoglobinopathy screening was previously performed by [facility] and was normal (AA, MCV 86).  We obtained a detailed family history and pregnancy history.  Ms. Balinski reported that the partner, Madaline Guthrie, has a nephew (his sister's son) with autism and absent speech.  The family believes this is the result of significant drug use by his father. There are no other family members with a similar condition and we have no record of any genetic testing for this individual.  We reviewed that autism and developmental delays may have many causes including inherited genetic syndromes, environmental factors and most likely, multifactorial etiologies. Without a known underlying genetic condition, it is difficult to accurately assess the chance for a similar condition in relatives, though we would expect a low likelihood for this pregnancy as a third degree relative.  We discussed Fragile X syndrome as a condition associated with autism and for which screening is available, however, since Madaline Guthrie has had normal development and is the intervening female relative, it is much less likely that this would be a concern for this pregnancy.  If more is learned, we are happy to review medical records and discuss this further.  Also of note, the patient described that her brother, who is  now 68 years old and in good health, lost the ability to walk, talk or care for himself at the age of 38.  His condition had a sudden onset and lasted for about a year. With therapy he was able to regain all abilities.  She stated that this doctors had no idea what caused his condition, which makes it difficult to determine any possible risks to other family members.  The remainder of the family history was reported to be unremarkable for birth defects, intellectual delays, recurrent pregnancy loss or known chromosome abnormalities.  Ms. Saban stated that this is her second pregnancy, the first resulted in an early  miscarriage.  She reported no complications or exposures to medications, tobacco or recreational drugs in this pregnancy. Prior to learning that she was pregnant, at [redacted] weeks gestation, she was drinking alcohol multiple times per week, several drinks each time.  Alcohol consumption during pregnancy has been associated with a number of birth defects including growth delays, small head size, heart defects, eye anomalies and facial differences as well as learning disabilities and behavioral problems.  The risk of these to occur tends to increase with the amount of alcohol consumed, however, malformations have been seen with as little as two drinks per day.  Because there is no safe amount of alcohol consumption during pregnancy, we suggest women completely avoid alcohol while pregnant.  A level 2 ultrasound and fetal echocardiogram (a detailed ultrasound of the fetal heart after 20 weeks) may help to detect growth delays or birth defects associated with alcohol use.  However, it is important to remember that not all birth defects can be identified prenatally.  After consideration of the options, Nicole Rocha elected to proceed with scheduling an ultrasound at [redacted] weeks gestation.  She declined carrier screening as well as screening for chromosome conditions in this pregnancy.  Ms. Paszkiewicz was encouraged to call with questions or concerns.  We can be contacted at [phone].  Plan of care: Marland Kitchen Scheduled for anatomy ultrasound at [redacted] weeks gestation at [facility]. . Declined aneuploidy screening and carrier testing.   Labs ordered: none - declined  Cherly Anderson, MS, CGC

## 2021-01-05 ENCOUNTER — Other Ambulatory Visit: Payer: Self-pay | Admitting: General Practice

## 2021-01-05 DIAGNOSIS — Z3482 Encounter for supervision of other normal pregnancy, second trimester: Secondary | ICD-10-CM

## 2021-01-15 ENCOUNTER — Ambulatory Visit: Payer: Medicaid Other | Attending: Maternal & Fetal Medicine

## 2021-01-15 ENCOUNTER — Other Ambulatory Visit: Payer: Self-pay

## 2021-01-15 DIAGNOSIS — Z363 Encounter for antenatal screening for malformations: Secondary | ICD-10-CM | POA: Diagnosis not present

## 2021-01-15 DIAGNOSIS — Z3A18 18 weeks gestation of pregnancy: Secondary | ICD-10-CM | POA: Insufficient documentation

## 2021-01-15 DIAGNOSIS — Z3482 Encounter for supervision of other normal pregnancy, second trimester: Secondary | ICD-10-CM

## 2021-02-02 ENCOUNTER — Other Ambulatory Visit: Payer: Self-pay | Admitting: Family Medicine

## 2021-02-02 DIAGNOSIS — Z369 Encounter for antenatal screening, unspecified: Secondary | ICD-10-CM

## 2021-02-10 ENCOUNTER — Ambulatory Visit: Payer: Self-pay

## 2021-02-11 ENCOUNTER — Other Ambulatory Visit: Payer: Self-pay

## 2021-02-11 ENCOUNTER — Ambulatory Visit: Payer: Self-pay | Admitting: Family Medicine

## 2021-02-11 VITALS — BP 109/67 | HR 96 | Temp 98.3°F | Wt 186.4 lb

## 2021-02-11 DIAGNOSIS — Z3480 Encounter for supervision of other normal pregnancy, unspecified trimester: Secondary | ICD-10-CM

## 2021-02-11 DIAGNOSIS — Z9119 Patient's noncompliance with other medical treatment and regimen: Secondary | ICD-10-CM

## 2021-02-11 DIAGNOSIS — Z3482 Encounter for supervision of other normal pregnancy, second trimester: Secondary | ICD-10-CM

## 2021-02-11 DIAGNOSIS — Z91199 Patient's noncompliance with other medical treatment and regimen due to unspecified reason: Secondary | ICD-10-CM

## 2021-02-11 MED ORDER — PRENATAL VITAMIN 27-0.8 MG PO TABS: 1.0000 | ORAL_TABLET | Freq: Every day | ORAL | 0 refills | Status: DC

## 2021-02-11 NOTE — Progress Notes (Signed)
   PRENATAL VISIT NOTE  Subjective:  Nicole Rocha is a 20 y.o. G2P0010 at [redacted]w[redacted]d being seen today for ongoing prenatal care.  She is currently monitored for the following issues for this low-risk pregnancy and has Supervision of other normal pregnancy, antepartum on their problem list.  Patient reports Shortness of breath, ligament pain .  Contractions: Irritability. Vag. Bleeding: None.  Movement: Present. Denies leaking of fluid/ROM.   The following portions of the patient's history were reviewed and updated as appropriate: allergies, current medications, past family history, past medical history, past social history, past surgical history and problem list. Problem list updated.  Objective:   Vitals:   02/11/21 1320  BP: 109/67  Pulse: 96  Temp: 98.3 F (36.8 C)  Weight: 186 lb 6.4 oz (84.6 kg)    Fetal Status: Fetal Heart Rate (bpm): 152 Fundal Height: 24 cm Movement: Present     General:  Alert, oriented and cooperative. Patient is in no acute distress.  Skin: Skin is warm and dry. No rash noted.   Cardiovascular: Normal heart rate noted  Respiratory: Normal respiratory effort, no problems with respiration noted  Abdomen: Soft, gravid, appropriate for gestational age.  Pain/Pressure: Absent     Pelvic: Cervical exam deferred        Extremities: Normal range of motion.  Edema: Trace  Mental Status: Normal mood and affect. Normal behavior. Normal judgment and thought content.   Assessment and Plan:  Pregnancy: G2P0010 at [redacted]w[redacted]d  1. Supervision of other normal pregnancy, antepartum - patient reports SOB, discussed normal physiological changes with pregnancy.  Discussed not wearing bra with under wire on most days and wearing proper fitting bra.  - aculeated lungs- clear  - Patient reports not having enough folic iron in the gummy PNV- recommended patient take PNV offered her.  Patient given PNV today.   - encouraged to take  PNV daily  2. Non-compliance Patient has  missed ~ 2 appointments.   Emphasized the importance of PNC   Preterm labor symptoms and general obstetric precautions including but not limited to vaginal bleeding, contractions, leaking of fluid and fetal movement were reviewed in detail with the patient. Please refer to After Visit Summary for other counseling recommendations.  Return in about 4 weeks (around 03/11/2021) for routine prenatal care.  Future Appointments  Date Time Provider Department Center  03/05/2021  1:00 PM [facility] US[facility]   M. Yemen used for [place] interpretation.     Wendi Snipes, FNP

## 2021-02-11 NOTE — Addendum Note (Signed)
Addended by: Jossie Ng on: 02/11/2021 03:26 PM   Modules accepted: Orders

## 2021-02-11 NOTE — Progress Notes (Signed)
Encouraged to take PNV QD as taking ~ 4 days out of a week. Client is taking gummy PNV. Verified client and emergency contact numbers correct on demographic screen. Jossie Ng, RN

## 2021-02-13 ENCOUNTER — Emergency Department
Admission: EM | Admit: 2021-02-13 | Discharge: 2021-02-13 | Disposition: A | Discharge status: Home / Self Care | Payer: Medicaid Other | Attending: Emergency Medicine | Admitting: Emergency Medicine

## 2021-02-13 ENCOUNTER — Other Ambulatory Visit: Payer: Self-pay

## 2021-02-13 DIAGNOSIS — O99512 Diseases of the respiratory system complicating pregnancy, second trimester: Secondary | ICD-10-CM | POA: Diagnosis not present

## 2021-02-13 DIAGNOSIS — Z20822 Contact with and (suspected) exposure to covid-19: Secondary | ICD-10-CM | POA: Diagnosis not present

## 2021-02-13 DIAGNOSIS — R0602 Shortness of breath: Secondary | ICD-10-CM | POA: Diagnosis not present

## 2021-02-13 DIAGNOSIS — Z3A22 22 weeks gestation of pregnancy: Secondary | ICD-10-CM | POA: Insufficient documentation

## 2021-02-13 LAB — CBC WITH DIFFERENTIAL/PLATELET
Abs Immature Granulocytes: 0.1 10*3/uL — ABNORMAL HIGH (ref 0.00–0.07)
Basophils Absolute: 0 10*3/uL (ref 0.0–0.1)
Basophils Relative: 0 %
Eosinophils Absolute: 0.1 10*3/uL (ref 0.0–0.5)
Eosinophils Relative: 1 %
HCT: 31.4 % — ABNORMAL LOW (ref 36.0–46.0)
Hemoglobin: 10.6 g/dL — ABNORMAL LOW (ref 12.0–15.0)
Immature Granulocytes: 1 %
Lymphocytes Relative: 13 %
Lymphs Abs: 1.3 10*3/uL (ref 0.7–4.0)
MCH: 29.1 pg (ref 26.0–34.0)
MCHC: 33.8 g/dL (ref 30.0–36.0)
MCV: 86.3 fL (ref 80.0–100.0)
Monocytes Absolute: 0.5 10*3/uL (ref 0.1–1.0)
Monocytes Relative: 5 %
Neutro Abs: 7.9 10*3/uL — ABNORMAL HIGH (ref 1.7–7.7)
Neutrophils Relative %: 80 %
Platelets: 298 10*3/uL (ref 150–400)
RBC: 3.64 MIL/uL — ABNORMAL LOW (ref 3.87–5.11)
RDW: 13.6 % (ref 11.5–15.5)
WBC: 9.8 10*3/uL (ref 4.0–10.5)
nRBC: 0 % (ref 0.0–0.2)

## 2021-02-13 LAB — TROPONIN I (HIGH SENSITIVITY)
Troponin I (High Sensitivity): 3 ng/L (ref <18)
Troponin I (High Sensitivity): <2 ng/L (ref <18)

## 2021-02-13 LAB — URINALYSIS, COMPLETE (UACMP) WITH MICROSCOPIC
Bacteria, UA: NONE SEEN
Bilirubin Urine: NEGATIVE
Glucose, UA: NEGATIVE mg/dL
Hgb urine dipstick: NEGATIVE
Ketones, ur: NEGATIVE mg/dL
Leukocytes,Ua: NEGATIVE
Nitrite: NEGATIVE
Protein, ur: NEGATIVE mg/dL
Specific Gravity, Urine: 1.008 (ref 1.005–1.030)
pH: 9 — ABNORMAL HIGH (ref 5.0–8.0)

## 2021-02-13 LAB — COMPREHENSIVE METABOLIC PANEL
ALT: 17 U/L (ref 0–44)
AST: 20 U/L (ref 15–41)
Albumin: 3.5 g/dL (ref 3.5–5.0)
Alkaline Phosphatase: 65 U/L (ref 38–126)
Anion gap: 9 (ref 5–15)
BUN: 8 mg/dL (ref 6–20)
CO2: 22 mmol/L (ref 22–32)
Calcium: 8.9 mg/dL (ref 8.9–10.3)
Chloride: 103 mmol/L (ref 98–111)
Creatinine, Ser: 0.39 mg/dL — ABNORMAL LOW (ref 0.44–1.00)
GFR, Estimated: >60 mL/min (ref >60)
Glucose, Bld: 120 mg/dL — ABNORMAL HIGH (ref 70–99)
Potassium: 3.5 mmol/L (ref 3.5–5.1)
Sodium: 134 mmol/L — ABNORMAL LOW (ref 135–145)
Total Bilirubin: 0.5 mg/dL (ref 0.3–1.2)
Total Protein: 6.8 g/dL (ref 6.5–8.1)

## 2021-02-13 MED ORDER — FAMOTIDINE 20 MG PO TABS
20.0000 mg | ORAL_TABLET | Freq: Once | ORAL | Status: AC
  Administered 2021-02-13: 20 mg via ORAL
  Filled 2021-02-13: qty 1

## 2021-02-13 NOTE — ED Notes (Addendum)
Discharge instructions reviewed using interpreter [phone] - before completing d/c instructions the service dropped and had to reconnect -- finished with interpreter [phone].  Pt agreeable with d/c plan as discussed by Dr Larinda Buttery - this nurse has verbally reinforced d/c instructions and provided pt with written copy- pt acknowledges verbal understanding and denies any additional questions, concerns, needs.

## 2021-02-13 NOTE — ED Provider Notes (Signed)
[facility] Emergency Department Provider Note ____________________________________________   Event Date/Time   First MD Initiated Contact with Patient 02/13/21 1650     (approximate)  I have reviewed the triage vital signs and the nursing notes.   HISTORY  Chief Complaint Shortness of Breath  HPI Nicole Rocha is a 20 y.o. female [redacted] weeks pregnant with no chronic past medical history presents to the emergency department for treatment and evaluation of shortness of breath. Symptoms have been present for 20 days. Shortness of breath seems worse while eating, when the baby moves and while walking. She mentioned these symptoms to the OB/GYN who told her if it got worse to go to the ER.  She also feels that her vision is becoming "more blurry" since she has been pregnant. No alleviating measures attempted.      History reviewed. No pertinent past medical history.  Patient Active Problem List   Diagnosis Date Noted  . Supervision of other normal pregnancy, antepartum 11/26/2020    History reviewed. No pertinent surgical history.  Prior to Admission medications   Medication Sig Start Date End Date Taking? Authorizing Provider  metoCLOPramide (REGLAN) 10 MG tablet Take 1 tablet (10 mg total) by mouth every 8 (eight) hours as needed for nausea. Patient not taking: No sig reported 11/16/20 11/16/21  Ward, Layla Maw, DO  Prenatal Vit-Fe Fumarate-FA (PRENATAL VITAMIN) 27-0.8 MG TABS Take 1 tablet by mouth daily at 6 (six) AM. 02/11/21   Federico Flake, MD    Allergies Patient has no known allergies.  Family History  Problem Relation Age of Onset  . Healthy Mother   . Healthy Father   . Healthy Brother   . Healthy Brother     Social History Social History   Tobacco Use  . Smoking status: Never Smoker  . Smokeless tobacco: Never Used  . Tobacco comment: Denies secondhand smoke  Vaping Use  . Vaping Use: Never used  Substance Use Topics   . Alcohol use: Not Currently    Comment: occasionally before pregnancy  . Drug use: Not Currently    Review of Systems  Constitutional: No fever/chills Eyes: Increase in blurred vision. ENT: No sore throat. Cardiovascular: Intermittent chest pain. Respiratory: Positive for shortness of breath. Gastrointestinal: No abdominal pain.  No nausea, no vomiting.  No diarrhea.  No constipation. Genitourinary: Negative for dysuria. Musculoskeletal: Negative for back pain. Negative for leg pain.  Skin: Negative for rash. Neurological: Negative for headaches, focal weakness or numbness. ____________________________________________   PHYSICAL EXAM:  VITAL SIGNS: ED Triage Vitals [02/13/21 1638]  Enc Vitals Group     BP 125/73     Pulse Rate 93     Resp 18     Temp 98.2 F (36.8 C)     Temp Source Oral     SpO2 99 %     Weight 183 lb (83 kg)     Height 5\' 6"  (1.676 m)     Head Circumference      Peak Flow      Pain Score 0     Pain Loc      Pain Edu?      Excl. in GC?     Constitutional: Alert and oriented. Well appearing and in no acute distress. Eyes: Conjunctivae are normal. PERRL. Head: Atraumatic. Nose: No congestion/rhinnorhea. Mouth/Throat: Mucous membranes are moist.  Oropharynx non-erythematous. Neck: No stridor.   Hematological/Lymphatic/Immunilogical: No cervical lymphadenopathy. Cardiovascular: Normal rate, regular rhythm. Grossly normal heart sounds.  Good peripheral circulation. No peripheral edema. Respiratory: Normal respiratory effort.  No retractions. Lungs CTAB. Gastrointestinal: Soft and nontender. Gravid.  Musculoskeletal: No lower extremity tenderness nor edema.  No joint effusions. Neurologic:  Normal speech and language. No gross focal neurologic deficits are appreciated. No gait instability. Skin:  Skin is warm, dry and intact. No rash noted. Psychiatric: Mood and affect are normal. Speech and behavior are  normal.  ____________________________________________   LABS (all labs ordered are listed, but only abnormal results are displayed)  Labs Reviewed  URINALYSIS, COMPLETE (UACMP) WITH MICROSCOPIC - Abnormal; Notable for the following components:      Result Value   Color, Urine YELLOW (*)    APPearance HAZY (*)    pH 9.0 (*)    All other components within normal limits  COMPREHENSIVE METABOLIC PANEL - Abnormal; Notable for the following components:   Sodium 134 (*)    Glucose, Bld 120 (*)    Creatinine, Ser 0.39 (*)    All other components within normal limits  CBC WITH DIFFERENTIAL/PLATELET - Abnormal; Notable for the following components:   RBC 3.64 (*)    Hemoglobin 10.6 (*)    HCT 31.4 (*)    Neutro Abs 7.9 (*)    Abs Immature Granulocytes 0.10 (*)    All other components within normal limits  SARS CORONAVIRUS 2 (TAT 6-24 HRS)  CBG MONITORING, ED  TROPONIN I (HIGH SENSITIVITY)  TROPONIN I (HIGH SENSITIVITY)   ____________________________________________  EKG ____________________________________________  RADIOLOGY  ED MD interpretation:    Not indicated.  I, Kem Boroughs, personally viewed and evaluated these images (plain radiographs) as part of my medical decision making, as well as reviewing the written report by the radiologist.  Official radiology report(s): No results found.  ____________________________________________   PROCEDURES  Procedure(s) performed (including Critical Care):  Procedures  ____________________________________________   INITIAL IMPRESSION / ASSESSMENT AND PLAN     20 year old female presenting to the ER for evaluation of shortness of breath. Vital signs are reassuring.   DIFFERENTIAL DIAGNOSIS  COVID, PE, Pregnancy related dyspnea, GERD  ED COURSE  Vital signs have remained stable. Awaiting results of second troponin. She currently denies chest pain.  COVID 19 results will be available within about 24 hours.   Symptoms of dyspnea most likely related to GERD in pregnancy or anxiety. Once result of second troponin have reported, she will be ready for discharge and follow up with OB/GYN. Care relinquished to Dr. Larinda Buttery who will watch for that result and make disposition.    ___________________________________________   FINAL CLINICAL IMPRESSION(S) / ED DIAGNOSES  Final diagnoses:  Shortness of breath     ED Discharge Orders    None       Nicole Rocha was evaluated in Emergency Department on 02/13/2021 for the symptoms described in the history of present illness. She was evaluated in the context of the global COVID-19 pandemic, which necessitated consideration that the patient might be at risk for infection with the SARS-CoV-2 virus that causes COVID-19. Institutional protocols and algorithms that pertain to the evaluation of patients at risk for COVID-19 are in a state of rapid change based on information released by regulatory bodies including the CDC and federal and state organizations. These policies and algorithms were followed during the patient's care in the ED.   Note:  This document was prepared using Dragon voice recognition software and may include unintentional dictation errors.   Chinita Pester, FNP 02/15/21 1503    Jessup,  Leonette Most, MD 02/17/21 1806

## 2021-02-13 NOTE — ED Triage Notes (Signed)
SOB over last 20 days, worsening today. States SOB is at rest and with movement. Pt [redacted] weeks pregnant, no complications.

## 2021-02-13 NOTE — ED Notes (Addendum)
Pt now escorted to ED lobby to await ride -- pt ambulates independently with steady gait- no acute distress.

## 2021-02-13 NOTE — ED Notes (Signed)
Spanish speaking patient.  Will need interpreter service for contact.

## 2021-02-13 NOTE — ED Provider Notes (Signed)
-----------------------------------------   9:04 PM on 02/13/2021 -----------------------------------------  Patient evaluated independently by myself in conjunction with evaluation by NP Triplett.  Patient is a 20 year old female, approximately [redacted] weeks pregnant, presenting for difficulty breathing.  She is not in any respiratory distress and is maintaining O2 sats on room air.  EKG shows no evidence of arrhythmia or ischemia and labs are unremarkable, troponin within normal limits.  I doubt PE given reassuring vital signs and I suspect she has some mild difficulty breathing due to her pregnancy.  She is appropriate for discharge home with OB/GYN follow-up, was counseled to return to the ED for new worsening symptoms, patient agrees with plan.   Chesley Noon, MD 02/13/21 2107

## 2021-02-13 NOTE — Discharge Instructions (Signed)
Your testing today does not show emergent concerns.   Your COVID test is still in process and will be available sometime tomorrow. You can find it in your MyChart account.  Please do not eat for at least 30 minutes before taking a nap or going to bed.  Continue taking your prenatal vitamins.

## 2021-02-14 LAB — SARS CORONAVIRUS 2 (TAT 6-24 HRS): SARS Coronavirus 2: NEGATIVE

## 2021-02-25 ENCOUNTER — Telehealth: Payer: Self-pay

## 2021-02-25 NOTE — Telephone Encounter (Signed)
TC to patient to schedule next MH RV around 03/11/2021. No VM set up, unable to LM. TC to patient emergency contact, Nicole Rocha, at [phone] and asked him to have her call us for appointment. Number to call given to [facility] and he agreed to give patient the message to call [facility] and to set up her VM. [address], Nicole Rocha  XA#[phone].Marland KitchenMarland KitchenBurt Knack, RN

## 2021-02-26 NOTE — Telephone Encounter (Signed)
Call to client to schedule [facility] RV follow-up appt for 03/11/2021 with Salli Real. Appt scheduled for 03/11/2021. Jossie Ng, RN

## 2021-03-04 NOTE — Addendum Note (Signed)
Addended by: Heywood Bene on: 03/04/2021 02:46 PM   Modules accepted: Orders

## 2021-03-05 ENCOUNTER — Ambulatory Visit: Payer: Medicaid Other | Attending: Family Medicine

## 2021-03-05 ENCOUNTER — Other Ambulatory Visit: Payer: Self-pay

## 2021-03-05 DIAGNOSIS — Z369 Encounter for antenatal screening, unspecified: Secondary | ICD-10-CM

## 2021-03-05 DIAGNOSIS — Z363 Encounter for antenatal screening for malformations: Secondary | ICD-10-CM | POA: Diagnosis present

## 2021-03-05 DIAGNOSIS — Z3A25 25 weeks gestation of pregnancy: Secondary | ICD-10-CM | POA: Insufficient documentation

## 2021-03-11 ENCOUNTER — Ambulatory Visit: Payer: Self-pay | Admitting: Advanced Practice Midwife

## 2021-03-11 ENCOUNTER — Encounter: Payer: Self-pay | Admitting: Advanced Practice Midwife

## 2021-03-11 ENCOUNTER — Other Ambulatory Visit: Payer: Self-pay

## 2021-03-11 VITALS — BP 106/67 | HR 105 | Temp 97.7°F | Wt 193.8 lb

## 2021-03-11 DIAGNOSIS — R87619 Unspecified abnormal cytological findings in specimens from cervix uteri: Secondary | ICD-10-CM | POA: Insufficient documentation

## 2021-03-11 DIAGNOSIS — Z3482 Encounter for supervision of other normal pregnancy, second trimester: Secondary | ICD-10-CM

## 2021-03-11 DIAGNOSIS — Z3480 Encounter for supervision of other normal pregnancy, unspecified trimester: Secondary | ICD-10-CM

## 2021-03-11 DIAGNOSIS — R87612 Low grade squamous intraepithelial lesion on cytologic smear of cervix (LGSIL): Secondary | ICD-10-CM

## 2021-03-11 DIAGNOSIS — O99019 Anemia complicating pregnancy, unspecified trimester: Secondary | ICD-10-CM | POA: Insufficient documentation

## 2021-03-11 DIAGNOSIS — O99012 Anemia complicating pregnancy, second trimester: Secondary | ICD-10-CM

## 2021-03-11 HISTORY — DX: Unspecified abnormal cytological findings in specimens from cervix uteri: R87.619

## 2021-03-11 LAB — HEMOGLOBIN, FINGERSTICK: Hemoglobin: 10.8 g/dL — ABNORMAL LOW (ref 11.1–15.9)

## 2021-03-11 LAB — WET PREP FOR TRICH, YEAST, CLUE
Trichomonas Exam: NEGATIVE
Yeast Exam: NEGATIVE

## 2021-03-11 MED ORDER — FERROUS SULFATE 325 (65 FE) MG PO TABS: 325.0000 mg | ORAL_TABLET | Freq: Every day | ORAL | 3 refills | Status: DC

## 2021-03-11 NOTE — Progress Notes (Addendum)
Wet prep reviewed. WNL Hgb = 10.8. Provider Hazle Coca, CNM reviewed. Initiated anemia standing order.   Ferrous Sulfate 325mg  given today advised to take daily with juice that has vit C.    Anemia in Pregnancy pamphlet given to patient.   Reminded patient per E. Sciora, CNM that pap has to be repeated in a year ~ February 2023.   March 2023, RN

## 2021-03-11 NOTE — Progress Notes (Signed)
   PRENATAL VISIT NOTE  Subjective:  Nicole Rocha is a 20 y.o. G2P0010 at [redacted]w[redacted]d being seen today for ongoing prenatal care.  She is currently monitored for the following issues for this low-risk pregnancy and has Supervision of other normal pregnancy, antepartum; Abnormal Pap smear of cervix 11/26/20 LSIL; and Anemia affecting pregnancy on their problem list.  Patient reports watery malodorous discharge x 20 days.  Contractions: Not present. Vag. Bleeding: None.  Movement: Present. Denies leaking of fluid/ROM.   The following portions of the patient's history were reviewed and updated as appropriate: allergies, current medications, past family history, past medical history, past social history, past surgical history and problem list. Problem list updated.  Objective:   Vitals:   03/11/21 1319  BP: 106/67  Pulse: (!) 105  Temp: 97.7 F (36.5 C)  Weight: 193 lb 12.8 oz (87.9 kg)    Fetal Status: Fetal Heart Rate (bpm): 140 Fundal Height: 26 cm Movement: Present     General:  Alert, oriented and cooperative. Patient is in no acute distress.  Skin: Skin is warm and dry. No rash noted.   Cardiovascular: Normal heart rate noted  Respiratory: Normal respiratory effort, no problems with respiration noted  Abdomen: Soft, gravid, appropriate for gestational age.  Pain/Pressure: Absent     Pelvic: Cervical exam deferred        Extremities: Normal range of motion.  Edema: None  Mental Status: Normal mood and affect. Normal behavior. Normal judgment and thought content.   Assessment and Plan:  Pregnancy: G2P0010 at [redacted]w[redacted]d  1. Supervision of other normal pregnancy, antepartum Not working. Living with uncle.  Feels well but c/o watery malodorous leukorrhea x 20 days--wet mount done with no leukorrhea seen, ph<4.5 Reviewed 01/15/21 u/s with anterior placenta, EFW=41%, AFI wnl, suboptimal anatomy due to fetal position, declined genetic screening. 17 6/7 with EDC=06/17/21. Recommend f/u u/s in  4-6 wks for anatomy Reviewed 03/05/21 u/s for f/u growth with normal interval growth at 25 4/7 with EDC=06/17/21, EFW=71%, AFI wnl, anatomy wnl Not exercising--encouraged to do 3x/wk Reviewed 02/13/21 ER note when pt presented for SOB x 20 days worse when eating, with fetal movement, walking. EKG done. Dyspnea symptoms most likely related to GERD or anxiety - WET PREP FOR TRICH, YEAST, CLUE  2. Low grade squamous intraepithelial lesion on cytologic smear of cervix (LGSIL) Needs repeat pap in 1 year (11/2021)  3. Anemia affecting pregnancy, antepartum Pt states was never told she needs iron pills--to give today - Hemoglobin, venipuncture - ferrous sulfate 325 (65 FE) MG tablet; Take 1 tablet (325 mg total) by mouth daily with breakfast.  Dispense: 100 tablet; Refill: 3   Preterm labor symptoms and general obstetric precautions including but not limited to vaginal bleeding, contractions, leaking of fluid and fetal movement were reviewed in detail with the patient. Please refer to After Visit Summary for other counseling recommendations.  No follow-ups on file.  No future appointments.  Alberteen Spindle, CNM

## 2021-03-25 ENCOUNTER — Ambulatory Visit: Payer: Self-pay

## 2021-03-26 ENCOUNTER — Ambulatory Visit: Payer: Self-pay | Admitting: Advanced Practice Midwife

## 2021-03-26 ENCOUNTER — Other Ambulatory Visit: Payer: Self-pay

## 2021-03-26 VITALS — BP 105/69 | HR 99 | Temp 97.9°F | Wt 199.1 lb

## 2021-03-26 DIAGNOSIS — Z3480 Encounter for supervision of other normal pregnancy, unspecified trimester: Secondary | ICD-10-CM

## 2021-03-26 DIAGNOSIS — O99019 Anemia complicating pregnancy, unspecified trimester: Secondary | ICD-10-CM

## 2021-03-26 LAB — HEMOGLOBIN, FINGERSTICK: Hemoglobin: 10.9 g/dL — ABNORMAL LOW (ref 11.1–15.9)

## 2021-03-26 NOTE — Progress Notes (Addendum)
Hgb reviewed with provider during clinic visit.   Patient informed to restart taking iron once daily and to eat it with some protein to minimize nauseous. Anemia in pamphlet given again and the importance of added these foods in her diet.   Anemia lab panel added today.   Floy Sabina, RN

## 2021-03-26 NOTE — Progress Notes (Signed)
   PRENATAL VISIT NOTE  Subjective:  Nicole Rocha is a 20 y.o. G2P0010 at [redacted]w[redacted]d being seen today for ongoing prenatal care.  She is currently monitored for the following issues for this low-risk pregnancy and has Supervision of other normal pregnancy, antepartum; Abnormal Pap smear of cervix 11/26/20 LSIL; and Anemia affecting pregnancy on their problem list.  Patient reports vomiting.  Contractions: Not present. Vag. Bleeding: None.  Movement: Present. Denies leaking of fluid/ROM.   The following portions of the patient's history were reviewed and updated as appropriate: allergies, current medications, past family history, past medical history, past social history, past surgical history and problem list. Problem list updated.  Objective:   Vitals:   03/26/21 1324  BP: 105/69  Pulse: 99  Temp: 97.9 F (36.6 C)  Weight: 199 lb 1.6 oz (90.3 kg)    Fetal Status: Fetal Heart Rate (bpm): 150 Fundal Height: 30 cm Movement: Present     General:  Alert, oriented and cooperative. Patient is in no acute distress.  Skin: Skin iwarm and dry. No rash noted.   Cardiovascular: Normal heart rate noted  Respiratory: Normal respiratory effort, no problems with respiration noted  Abdomen: Soft, gravid, appropriate for gestational age.  Pain/Pressure: Absent     Pelvic: Cervical exam deferred        Extremities: Normal range of motion.  Edema: None  Mental Status: Normal mood and affect. Normal behavior. Normal judgment and thought content.   Assessment and Plan:  Pregnancy: G2P0010 at [redacted]w[redacted]d  1. Supervision of other normal pregnancy, antepartum 21 lb 1.6 oz (9.571 kg) Gained 6 lbs in past 2 wks. Not exercising--again encouraged need to exercise 4x/wk Not working Screened for depression (as pt has many psychosomatic symptoms this pregnancy): -cry, +sadness, -anhedonia, appetite wnl, sleep wn, -SI/HI, no hx depression/anxiety dx. Pt states she has someone she trusts to talk to about her  feelings and worries.  - Hemoglobin, fingerstick - Tdap vaccine greater than or equal to 7yo IM - Glucose, 1 hour gestational - HIV-1/HIV-2 Qualitative RNA - RPR  2. Anemia affecting pregnancy, antepartum Pt states she stopped taking FeSo4 1 week ago when she began taking I FeSo4 due to vomiting immediately-20 min after eating lunch or dinner. Last night for dinner ate one plate of chicken, salsa verde, cheese, and lettuce and vomited after that. Pointed out that vomiting is not caused by FeSo4 because she stopped taking FeSo4 1 week ago. Counseled to eat smaller portions as that may be reason for vomiting after eating. Encouraged to take I FeSo4 with lunch   Preterm labor symptoms and general obstetric precautions including but not limited to vaginal bleeding, contractions, leaking of fluid and fetal movement were reviewed in detail with the patient. Please refer to After Visit Summary for other counseling recommendations.  Return in about 2 weeks (around 04/09/2021) for routine PNC.  No future appointments.  Alberteen Spindle, CNM

## 2021-03-26 NOTE — Addendum Note (Signed)
Addended by: Floy Sabina on: 03/26/2021 03:15 PM   Modules accepted: Orders

## 2021-03-27 ENCOUNTER — Telehealth: Payer: Self-pay

## 2021-03-27 ENCOUNTER — Encounter: Payer: Self-pay | Admitting: Advanced Practice Midwife

## 2021-03-27 DIAGNOSIS — O99810 Abnormal glucose complicating pregnancy: Secondary | ICD-10-CM | POA: Insufficient documentation

## 2021-03-27 LAB — FE+CBC/D/PLT+TIBC+FER+RETIC
Basophils Absolute: 0 10*3/uL (ref 0.0–0.2)
Basos: 0 %
EOS (ABSOLUTE): 0.1 10*3/uL (ref 0.0–0.4)
Eos: 1 %
Ferritin: 12 ng/mL — ABNORMAL LOW (ref 15–150)
Hematocrit: 32.6 % — ABNORMAL LOW (ref 34.0–46.6)
Hemoglobin: 10.9 g/dL — ABNORMAL LOW (ref 11.1–15.9)
Immature Grans (Abs): 0.1 10*3/uL (ref 0.0–0.1)
Immature Granulocytes: 1 %
Iron Saturation: 10 % — ABNORMAL LOW (ref 15–55)
Iron: 45 ug/dL (ref 27–159)
Lymphocytes Absolute: 1.1 10*3/uL (ref 0.7–3.1)
Lymphs: 12 %
MCH: 29.1 pg (ref 26.6–33.0)
MCHC: 33.4 g/dL (ref 31.5–35.7)
MCV: 87 fL (ref 79–97)
Monocytes Absolute: 0.4 10*3/uL (ref 0.1–0.9)
Monocytes: 4 %
Neutrophils Absolute: 7.4 10*3/uL — ABNORMAL HIGH (ref 1.4–7.0)
Neutrophils: 82 %
Platelets: 294 10*3/uL (ref 150–450)
RBC: 3.75 x10E6/uL — ABNORMAL LOW (ref 3.77–5.28)
RDW: 12.9 % (ref 11.7–15.4)
Retic Ct Pct: 1.8 % (ref 0.6–2.6)
Total Iron Binding Capacity: 455 ug/dL — ABNORMAL HIGH (ref 250–450)
UIBC: 410 ug/dL (ref 131–425)
WBC: 9.1 10*3/uL (ref 3.4–10.8)

## 2021-03-27 LAB — HIV-1/HIV-2 QUALITATIVE RNA
HIV-1 RNA, Qualitative: NONREACTIVE
HIV-2 RNA, Qualitative: NONREACTIVE

## 2021-03-27 LAB — RPR: RPR Ser Ql: NONREACTIVE

## 2021-03-27 LAB — GLUCOSE, 1 HOUR GESTATIONAL: Gestational Diabetes Screen: 162 mg/dL — ABNORMAL HIGH (ref 65–139)

## 2021-03-27 NOTE — Telephone Encounter (Signed)
Client returned call to [facility] phone number and appt for 3 hour GTT scheduled. Counseled client regarding 1 hr glucola results, need for 3 hour GTT and test prep instructions reviewed with client multiple times. Client verbalized understanding and correctly stated could not have anything to eat or drink  after 8 pm 03/31/21 except sips of water. Jossie Ng, RN

## 2021-03-27 NOTE — Telephone Encounter (Signed)
03/26/2021 1 hour glucola = 162 and needs  3 hour GTT. Call to client with Marlene Yemen and left message requesting she call [facility] regarding lab results. Number to call provided. Jossie Ng, RN

## 2021-04-01 ENCOUNTER — Other Ambulatory Visit: Payer: Self-pay

## 2021-04-01 DIAGNOSIS — O99810 Abnormal glucose complicating pregnancy: Secondary | ICD-10-CM

## 2021-04-01 NOTE — Progress Notes (Signed)
In Nurse Clinic for 3 hr GTT. Reports npo since yesterday (03/31/2021) at 7:30 pm. Instructions given for test today. Advised to notify RN if n/v. Has return [facility] appt for 04/09/2021, arrive 1:45 pm, pt aware. M. Yemen, interpreter. Jerel Shepherd, RN

## 2021-04-02 ENCOUNTER — Other Ambulatory Visit: Payer: Self-pay | Admitting: Advanced Practice Midwife

## 2021-04-02 DIAGNOSIS — O99810 Abnormal glucose complicating pregnancy: Secondary | ICD-10-CM

## 2021-04-02 LAB — GLUCOSE TOLERANCE TEST, 6 HOUR
Glucose, 1 Hour GTT: 197 mg/dL (ref 65–199)
Glucose, 2 hour: 117 mg/dL (ref 65–139)
Glucose, 3 hour: 124 mg/dL — ABNORMAL HIGH (ref 65–109)
Glucose, GTT - Fasting: 81 mg/dL (ref 65–99)

## 2021-04-03 ENCOUNTER — Telehealth: Payer: Self-pay | Admitting: Dietician

## 2021-04-06 NOTE — Addendum Note (Signed)
Addended by: Heywood Bene on: 04/06/2021 03:48 PM   Modules accepted: Orders

## 2021-04-08 ENCOUNTER — Encounter: Payer: Self-pay | Admitting: Obstetrics and Gynecology

## 2021-04-08 ENCOUNTER — Other Ambulatory Visit: Payer: Self-pay

## 2021-04-08 ENCOUNTER — Observation Stay
Admission: EM | Admit: 2021-04-08 | Discharge: 2021-04-09 | Disposition: A | Discharge status: Home / Self Care | Payer: Medicaid Other | Attending: Obstetrics and Gynecology | Admitting: Obstetrics and Gynecology

## 2021-04-08 DIAGNOSIS — Z3480 Encounter for supervision of other normal pregnancy, unspecified trimester: Secondary | ICD-10-CM

## 2021-04-08 DIAGNOSIS — Z3A30 30 weeks gestation of pregnancy: Secondary | ICD-10-CM

## 2021-04-08 DIAGNOSIS — Z3483 Encounter for supervision of other normal pregnancy, third trimester: Principal | ICD-10-CM | POA: Insufficient documentation

## 2021-04-08 DIAGNOSIS — O4193X0 Disorder of amniotic fluid and membranes, unspecified, third trimester, not applicable or unspecified: Secondary | ICD-10-CM

## 2021-04-08 LAB — URINALYSIS, COMPLETE (UACMP) WITH MICROSCOPIC
Bacteria, UA: NONE SEEN
Bilirubin Urine: NEGATIVE
Glucose, UA: NEGATIVE mg/dL
Hgb urine dipstick: NEGATIVE
Ketones, ur: NEGATIVE mg/dL
Leukocytes,Ua: NEGATIVE
Nitrite: NEGATIVE
Protein, ur: NEGATIVE mg/dL
Specific Gravity, Urine: 1.024 (ref 1.005–1.030)
pH: 7 (ref 5.0–8.0)

## 2021-04-08 LAB — RUPTURE OF MEMBRANE (ROM)PLUS: Rom Plus: NEGATIVE

## 2021-04-08 NOTE — OB Triage Note (Signed)
Pt [redacted]w[redacted]d G2P0 presents to BP w/ c/o LOF. States she was sitting and stood up and a large gush of clear watery fluid dripped down her leg to her feet. Pt reports no bleeding, positive fetal movement, and no ctx. Interpreter on a stick used during triage. Monitors applied and assessing. FHT's in 150's. VSS.

## 2021-04-08 NOTE — Final Progress Note (Signed)
Physician Final Progress Note  Patient ID: Nicole Rocha MRN: [id number] DOB/AGE: 04/04/2001 20 y.o.  Admit date: 04/08/2021 Admitting provider: Conard Novak, MD Discharge date: 04/08/2021   Admission Diagnoses:  1) intrauterine pregnancy at [redacted]w[redacted]d  2) concern for rupture of membranes  Discharge Diagnoses:  Active Problems:   Supervision of other normal pregnancy, antepartum   Labor and delivery, indication for care   [redacted] weeks gestation of pregnancy  No evidence of rupture of membranes  History of Present Illness: The patient is a 20 y.o. female G2P0010 at [redacted]w[redacted]d who presents for question of rupture of membranes.  Her pregnancy has been complicated by an equivocal 3 hour gtt. She notes a gush of fluid at about 9pm tonight.  She states that it went down her leg and even got her shoes wet. She has had some continued discharge, but not as heavy as the initial gush.  She denies vaginal bleeding. She denies contractions. She notes +FM.    Past Medical History:  Diagnosis Date   Abnormal Pap smear of cervix 11/26/20 LSIL 03/11/2021    Past Surgical History:  Procedure Laterality Date   NO PAST SURGERIES      No current facility-administered medications on file prior to encounter.   Current Outpatient Medications on File Prior to Encounter  Medication Sig Dispense Refill   ferrous sulfate 325 (65 FE) MG tablet Take 1 tablet (325 mg total) by mouth daily with breakfast. 100 tablet 3   metoCLOPramide (REGLAN) 10 MG tablet Take 1 tablet (10 mg total) by mouth every 8 (eight) hours as needed for nausea. (Patient not taking: No sig reported) 30 tablet 1   Prenatal Vit-Fe Fumarate-FA (PRENATAL VITAMIN) 27-0.8 MG TABS Take 1 tablet by mouth daily at 6 (six) AM. 100 tablet 0    No Known Allergies  Social History   Socioeconomic History   Marital status: Single    Spouse name: Not on file   Number of children: 0   Years of education: 7   Highest education level: Not on file   Occupational History   Not on file  Tobacco Use   Smoking status: Never   Smokeless tobacco: Never   Tobacco comments:    Denies secondhand smoke  Vaping Use   Vaping Use: Never used  Substance and Sexual Activity   Alcohol use: Not Currently    Comment: occasionally before pregnancy   Drug use: Not Currently   Sexual activity: Yes    Birth control/protection: None    Comment: Pregnant  Other Topics Concern   Not on file  Social History Narrative   Not on file   Social Determinants of Health   Financial Resource Strain: Not on file  Food Insecurity: No Food Insecurity   Worried About Running Out of Food in the Last Year: Never true   Ran Out of Food in the Last Year: Never true  Transportation Needs: No Transportation Needs   Lack of Transportation (Medical): No   Lack of Transportation (Non-Medical): No  Physical Activity: Not on file  Stress: Not on file  Social Connections: Not on file  Intimate Partner Violence: Not At Risk   Fear of Current or Ex-Partner: No   Emotionally Abused: No   Physically Abused: No   Sexually Abused: No    Family History  Problem Relation Age of Onset   Healthy Mother    Healthy Father    Healthy Brother    Healthy Brother  Review of Systems  Constitutional: Negative.   HENT: Negative.    Eyes: Negative.   Respiratory: Negative.    Cardiovascular: Negative.   Gastrointestinal: Negative.   Genitourinary: Negative.        See HPI  Musculoskeletal: Negative.   Skin: Negative.   Neurological: Negative.   Psychiatric/Behavioral: Negative.      Physical Exam: BP (!) 107/59   Pulse 92   Temp 98 F (36.7 C) (Oral)   LMP 09/04/2020   Physical Exam Constitutional:      General: She is not in acute distress.    Appearance: Normal appearance. She is well-developed.  Genitourinary:     Vulva, bladder and urethral meatus normal.     No lesions in the vagina.     Right Labia: No rash, tenderness, lesions, skin changes or  Bartholin's cyst.    Left Labia: No tenderness, lesions, skin changes, Bartholin's cyst or rash.    No inguinal adenopathy present in the right or left side.    Pelvic Tanner Score: 5/5.    No vaginal erythema, tenderness or bleeding.     Vaginal exam comments: Appears dry. No leaking of fluid from cervix with valsalva .      Right Adnexa: not tender, not full and no mass present.    Left Adnexa: not tender, not full and no mass present.    Cervix is nulliparous.     No cervical motion tenderness, friability, lesion or polyp.     Uterus is enlarged (gravid).     Uterus is not fixed or tender.     No urethral tenderness or mass present.     Pelvic exam was performed with patient in the lithotomy position.  HENT:     Head: Normocephalic and atraumatic.  Eyes:     General: No scleral icterus.    Conjunctiva/sclera: Conjunctivae normal.  Cardiovascular:     Rate and Rhythm: Normal rate and regular rhythm.     Heart sounds: No murmur heard.   No friction rub. No gallop.  Pulmonary:     Effort: Pulmonary effort is normal. No respiratory distress.     Breath sounds: Normal breath sounds. No wheezing or rales.  Abdominal:     General: Bowel sounds are normal. There is no distension.     Palpations: Abdomen is soft. There is no mass.     Tenderness: There is no abdominal tenderness. There is no guarding or rebound.     Hernia: There is no hernia in the left inguinal area or right inguinal area.  Musculoskeletal:        General: Normal range of motion.     Cervical back: Normal range of motion and neck supple.  Lymphadenopathy:     Lower Body: No right inguinal adenopathy. No left inguinal adenopathy.  Neurological:     General: No focal deficit present.     Mental Status: She is alert and oriented to person, place, and time.     Cranial Nerves: No cranial nerve deficit.  Skin:    General: Skin is warm and dry.     Findings: No erythema.  Psychiatric:        Mood and Affect: Mood  normal.        Behavior: Behavior normal.        Judgment: Judgment normal.  None Female chaperone present for pelvic exam:   Consults: None  Significant Findings/ Diagnostic Studies:  ROM Plus Lab Results  Component Value Date   ROMUACOMP  NEGATIVE 04/08/2021   ROM workup: Pooling: negative Ferning: negative Nitrazine: none available  Lab Results  Component Value Date   APPEARANCEUR HAZY (A) 04/08/2021   GLUCOSEU NEGATIVE 04/08/2021   BILIRUBINUR NEGATIVE 04/08/2021   KETONESUR NEGATIVE 04/08/2021   LABSPEC 1.024 04/08/2021   HGBUR NEGATIVE 04/08/2021   PHURINE 7.0 04/08/2021   NITRITE NEGATIVE 04/08/2021   LEUKOCYTESUR NEGATIVE 04/08/2021   RBCU 0-5 04/08/2021   WBCU 0-5 04/08/2021   BACTERIA NONE SEEN 04/08/2021   EPIU 0-5 04/08/2021     Procedures: NST Baseline FHR: 140 beats/min Variability: moderate Accelerations: present (10x10, multiple) Decelerations: absent Tocometry: 1-2 q 10 min  Interpretation:  INDICATIONS: rule out uterine contractions RESULTS:  A NST procedure was performed with FHR monitoring and a normal baseline established, appropriate time of 20-40 minutes of evaluation, and accels >2 seen w 15x15 characteristics.  Results show a REACTIVE NST.    Hospital Course: The patient was admitted to Labor and Delivery Triage for observation. She had normal vital signs. She had a reassuring fetal heart rate tracing.  She had a UA that showed no evidence of infection. She had a ROM+ that was negative.  Her cervix was closed.  She was reassured and instructed to follow up at her regular appointment.  Discharge Condition: stable  Disposition: Discharge disposition: 01-Home or Self Care      Diet: Regular diet  Discharge Activity: Activity as tolerated   Allergies as of 04/08/2021   No Known Allergies      Medication List     STOP taking these medications    metoCLOPramide 10 MG tablet Commonly known as: REGLAN       TAKE these  medications    ferrous sulfate 325 (65 FE) MG tablet Take 1 tablet (325 mg total) by mouth daily with breakfast.   Prenatal Vitamin 27-0.8 MG Tabs Take 1 tablet by mouth daily at 6 (six) AM.         Total time spent taking care of this patient: 33 minutes  Signed: Thomasene Mohair, MD  04/08/2021, 11:47 PM

## 2021-04-08 NOTE — Discharge Summary (Signed)
See Final Progress Note 

## 2021-04-08 NOTE — OB Triage Note (Signed)
Pt discharged home, discharge instructions given with RN at bedside using interpreter on a stick, teach back method used, pt verbalized understanding.

## 2021-04-09 ENCOUNTER — Encounter: Payer: Self-pay | Admitting: Physician Assistant

## 2021-04-09 ENCOUNTER — Ambulatory Visit: Payer: Self-pay | Admitting: Physician Assistant

## 2021-04-09 VITALS — BP 104/66 | HR 101 | Temp 98.4°F | Wt 200.2 lb

## 2021-04-09 DIAGNOSIS — O99019 Anemia complicating pregnancy, unspecified trimester: Secondary | ICD-10-CM

## 2021-04-09 DIAGNOSIS — Z3483 Encounter for supervision of other normal pregnancy, third trimester: Secondary | ICD-10-CM

## 2021-04-09 DIAGNOSIS — O99810 Abnormal glucose complicating pregnancy: Secondary | ICD-10-CM

## 2021-04-09 DIAGNOSIS — O99013 Anemia complicating pregnancy, third trimester: Secondary | ICD-10-CM

## 2021-04-09 DIAGNOSIS — Z3480 Encounter for supervision of other normal pregnancy, unspecified trimester: Secondary | ICD-10-CM

## 2021-04-09 NOTE — Progress Notes (Signed)
   PRENATAL VISIT NOTE  Subjective:  Nicole Rocha is a 20 y.o. G2P0010 at [redacted]w[redacted]d being seen today for ongoing prenatal care.  She is currently monitored for the following issues for this low-risk pregnancy and has Supervision of other normal pregnancy, antepartum; Abnormal Pap smear of cervix 11/26/20 LSIL; Anemia affecting pregnancy; Abnormal glucose affecting pregnancy--needs 3 hour GTT; Labor and delivery, indication for care; and [redacted] weeks gestation of pregnancy on their problem list.  Patient reports  increased vaginal discharge - went to hospital for this yest as she thought her water had broken, but was reassured and told cervix was closed. Has less discharge today, and denies vag odor or itch. Has some stomach discomfort with oral iron, which she takes with OJ and yogurt in the am .  Contractions: Not present. Vag. Bleeding: None.  Movement: Present. Denies leaking of fluid/ROM.   The following portions of the patient's history were reviewed and updated as appropriate: allergies, current medications, past family history, past medical history, past social history, past surgical history and problem list. Problem list updated.  Objective:   Vitals:   04/09/21 1358  BP: 104/66  Pulse: (!) 101  Temp: 98.4 F (36.9 C)  Weight: 200 lb 3.2 oz (90.8 kg)    Fetal Status: Fetal Heart Rate (bpm): 160 Fundal Height: 31 cm Movement: Present     General:  Alert, oriented and cooperative. Patient is in no acute distress.  Skin: Skin is warm and dry. No rash noted.   Cardiovascular: Normal heart rate noted  Respiratory: Normal respiratory effort, no problems with respiration noted  Abdomen: Soft, gravid, appropriate for gestational age.  Pain/Pressure: Absent     Pelvic: Cervical exam deferred        Extremities: Normal range of motion.  Edema: None  Mental Status: Normal mood and affect. Normal behavior. Normal judgment and thought content.   Assessment and Plan:  Pregnancy: G2P0010  at [redacted]w[redacted]d  1. Supervision of other normal pregnancy, antepartum Continue PNV. Enc po fluids due to very hot weather.  2. Anemia affecting pregnancy, antepartum Strategized ways to take - will try in evening. Consider IV iron if insufficient.  3. Abnormal glucose affecting pregnancy Had MNT for indeterminate 3h OGTT. Enc diet.   Preterm labor symptoms and general obstetric precautions including but not limited to vaginal bleeding, contractions, leaking of fluid and fetal movement were reviewed in detail with the patient. Please refer to After Visit Summary for other counseling recommendations.  Return in about 2 weeks (around 04/23/2021) for Routine prenatal care.  Future Appointments  Date Time Provider Department Center  04/23/2021  3:00 PM [facility] PROVIDER [facility] None    Landry Dyke, PA-C

## 2021-04-09 NOTE — Progress Notes (Signed)
Patient here for MH RV at 30 1/2. Had ED visit yesterday for concern of leaking fluid. Considering Mirena IUD for [facility] after delivery. States she is not taking iron because it makes her vomit.Burt Knack, RN

## 2021-04-23 ENCOUNTER — Encounter: Payer: Self-pay | Admitting: Physician Assistant

## 2021-04-23 ENCOUNTER — Ambulatory Visit: Payer: Self-pay | Admitting: Physician Assistant

## 2021-04-23 VITALS — BP 105/72 | HR 97 | Temp 98.0°F | Wt 201.2 lb

## 2021-04-23 DIAGNOSIS — O99810 Abnormal glucose complicating pregnancy: Secondary | ICD-10-CM

## 2021-04-23 DIAGNOSIS — O99013 Anemia complicating pregnancy, third trimester: Secondary | ICD-10-CM

## 2021-04-23 DIAGNOSIS — Z3483 Encounter for supervision of other normal pregnancy, third trimester: Secondary | ICD-10-CM

## 2021-04-23 DIAGNOSIS — Z3480 Encounter for supervision of other normal pregnancy, unspecified trimester: Secondary | ICD-10-CM

## 2021-04-23 DIAGNOSIS — O99019 Anemia complicating pregnancy, unspecified trimester: Secondary | ICD-10-CM

## 2021-04-23 LAB — HEMOGLOBIN, FINGERSTICK: Hemoglobin: 11.5 g/dL (ref 11.1–15.9)

## 2021-04-23 NOTE — Progress Notes (Signed)
   PRENATAL VISIT NOTE  Subjective:  Nicole Hoeschen is a 20 y.o. G2P0010 at [redacted]w[redacted]d being seen today for ongoing prenatal care.  She is currently monitored for the following issues for this low-risk pregnancy and has Supervision of other normal pregnancy, antepartum; Abnormal Pap smear of cervix 11/26/20 LSIL; Anemia affecting pregnancy; Abnormal glucose affecting pregnancy - Equivocal 3 hGTT; and Labor and delivery, indication for care on their problem list.  Patient reports  feet swell if she is standing for long periods .  Contractions: Not present. Vag. Bleeding: None.  Movement: Present.  Denies leaking of fluid/ROM.   The following portions of the patient's history were reviewed and updated as appropriate: allergies, current medications, past family history, past medical history, past social history, past surgical history and problem list. Problem list updated.  Objective:   Vitals:   04/23/21 1448  BP: 105/72  Pulse: 97  Temp: 98 F (36.7 C)  Weight: 201 lb 3.2 oz (91.3 kg)    Fetal Status: Fetal Heart Rate (bpm): 144 Fundal Height: 33 cm Movement: Present     General:  Alert, oriented and cooperative. Patient is in no acute distress.  Skin: Skin is warm and dry. No rash noted.   Cardiovascular: Normal heart rate noted  Respiratory: Normal respiratory effort, no problems with respiration noted  Abdomen: Soft, gravid, appropriate for gestational age.  Pain/Pressure: Absent     Pelvic: Cervical exam deferred        Extremities: Normal range of motion.  Edema: None  Mental Status: Normal mood and affect. Normal behavior. Normal judgment and thought content.   Assessment and Plan:  Pregnancy: G2P0010 at [redacted]w[redacted]d  1. Supervision of other normal pregnancy, antepartum Doing well. Enc to push fluids in light of very hot weather. Reassurance re: ankle edema in absence of other preeclampsia sx, enc to elevate feet.  2. Anemia affecting pregnancy, antepartum Pt not tolerating  oral iron supplements due to GI upset, but is taking daily PNV and eating iron-rich foods. Recheck Hgb today = 11.5, no supplement currently indicated.  - Hemoglobin, fingerstick  3. Abnormal glucose affecting pregnancy-- equivocal GTT Apparently has not yet had MNT counseling for equivocal GTT - referral sent today with pt in agreement. - Amb ref to Medical Nutrition Therapy-MNT   Preterm labor symptoms and general obstetric precautions including but not limited to vaginal bleeding, contractions, leaking of fluid and fetal movement were reviewed in detail with the patient.   Due to language barrier, an interpreter (M. [place]) was present during the provider portion of the visit with this patient.  Please refer to After Visit Summary for other counseling recommendations.  Return in about 2 weeks (around 05/07/2021) for Routine prenatal care.  No future appointments.  Landry Dyke, PA-C

## 2021-04-23 NOTE — Progress Notes (Signed)
Patient here at 32w 1 d for MH RV.   Hgb check due today. Hgb reviewed with provider during clinic visit. WNL. Patient states she is not taking iron, that is causes her to become nauseated. Advised patient increase iron-rich foods.   Patient also states she did not receive phone call regarding ADA diet counseling. Referral ordered today, informed patient to expect a phone and verified phone with patient.   Floy Sabina, RN

## 2021-04-24 ENCOUNTER — Telehealth: Payer: Self-pay | Admitting: Dietician

## 2021-05-07 ENCOUNTER — Telehealth: Payer: Self-pay

## 2021-05-07 ENCOUNTER — Ambulatory Visit: Payer: Self-pay

## 2021-05-07 NOTE — Telephone Encounter (Signed)
TC to patient to reschedule missed appointment. Patient phone not accepting calls, unable to LM. TC to patient emergency contact, LM asking him to have patient call us, number to call given.Burt Knack, RN

## 2021-05-07 NOTE — Telephone Encounter (Signed)
Patient appointment scheduled for 05/08/2021.Burt Knack, RN

## 2021-05-08 ENCOUNTER — Encounter: Payer: Self-pay | Admitting: Family Medicine

## 2021-05-08 ENCOUNTER — Other Ambulatory Visit: Payer: Self-pay

## 2021-05-08 ENCOUNTER — Ambulatory Visit: Payer: Self-pay | Admitting: Family Medicine

## 2021-05-08 VITALS — BP 111/68 | HR 97 | Temp 98.1°F | Wt 205.0 lb

## 2021-05-08 DIAGNOSIS — Z3483 Encounter for supervision of other normal pregnancy, third trimester: Secondary | ICD-10-CM

## 2021-05-08 DIAGNOSIS — Z9119 Patient's noncompliance with other medical treatment and regimen: Secondary | ICD-10-CM

## 2021-05-08 DIAGNOSIS — O99019 Anemia complicating pregnancy, unspecified trimester: Secondary | ICD-10-CM

## 2021-05-08 DIAGNOSIS — Z91199 Patient's noncompliance with other medical treatment and regimen due to unspecified reason: Secondary | ICD-10-CM

## 2021-05-08 DIAGNOSIS — O99810 Abnormal glucose complicating pregnancy: Secondary | ICD-10-CM

## 2021-05-08 DIAGNOSIS — O99013 Anemia complicating pregnancy, third trimester: Secondary | ICD-10-CM

## 2021-05-08 DIAGNOSIS — Z3480 Encounter for supervision of other normal pregnancy, unspecified trimester: Secondary | ICD-10-CM

## 2021-05-08 NOTE — Progress Notes (Signed)
   PRENATAL VISIT NOTE  Subjective:  Nicole Rocha is a 20 y.o. G2P0010 at [redacted]w[redacted]d being seen today for ongoing prenatal care.  She is currently monitored for the following issues for this low-risk pregnancy and has Supervision of other normal pregnancy, antepartum; Abnormal Pap smear of cervix 11/26/20 LSIL; Anemia affecting pregnancy; Abnormal glucose affecting pregnancy - Equivocal 3 hGTT; and Labor and delivery, indication for care on their problem list.  Patient reports no complaints.  Contractions: Not present. Vag. Bleeding: None.  Movement: Present. Denies leaking of fluid/ROM.   The following portions of the patient's history were reviewed and updated as appropriate: allergies, current medications, past family history, past medical history, past social history, past surgical history and problem list. Problem list updated.  Objective:   Vitals:   05/08/21 1534  BP: 111/68  Pulse: 97  Temp: 98.1 F (36.7 C)  Weight: 205 lb (93 kg)    Fetal Status: Fetal Heart Rate (bpm): 150 Fundal Height: 36 cm Movement: Present     General:  Alert, oriented and cooperative. Patient is in no acute distress.  Skin: Skin is warm and dry. No rash noted.   Cardiovascular: Normal heart rate noted  Respiratory: Normal respiratory effort, no problems with respiration noted  Abdomen: Soft, gravid, appropriate for gestational age.  Pain/Pressure: Absent     Pelvic: Cervical exam deferred        Extremities: Normal range of motion.  Edema: Trace  Mental Status: Normal mood and affect. Normal behavior. Normal judgment and thought content.   Assessment and Plan:  Pregnancy: G2P0010 at [redacted]w[redacted]d  1. Supervision of other normal pregnancy, antepartum  -As part of routine education we reviewed s/sx of pre-eclampsia and to seek medical care ASAP if present.  -PP contraception plan is IUD  -Feeding plan is breast  -Reviewed kick counts.   - Anticipated guidance 36 week labs and weekly visits until  delivery.    2. Anemia affecting pregnancy, antepartum Taking PNV and not taking FeSO4 as directed .    3. Non-compliance Not taking FeSO4.  Discussed Iron rich foods and also cooking in cast iron skillet to help with iron consumption.    4. Abnormal glucose affecting pregnancy--needs 3 hour GTT Pt given RD number to return call.  RD has made attempts to contact patient.     Preterm labor symptoms and general obstetric precautions including but not limited to vaginal bleeding, contractions, leaking of fluid and fetal movement were reviewed in detail with the patient. Please refer to After Visit Summary for other counseling recommendations.  No follow-ups on file.  Future Appointments  Date Time Provider Department Center  05/22/2021  3:40 PM [facility] PROVIDER [facility] None   M. [place] used for [place] interpretation.     Wendi Snipes, FNP

## 2021-05-11 ENCOUNTER — Ambulatory Visit: Payer: Self-pay | Admitting: Advanced Practice Midwife

## 2021-05-11 ENCOUNTER — Other Ambulatory Visit: Payer: Self-pay

## 2021-05-11 ENCOUNTER — Telehealth: Payer: Self-pay | Admitting: Family Medicine

## 2021-05-11 VITALS — BP 111/73 | HR 107 | Temp 97.4°F | Wt 202.6 lb

## 2021-05-11 DIAGNOSIS — Z3480 Encounter for supervision of other normal pregnancy, unspecified trimester: Secondary | ICD-10-CM

## 2021-05-11 DIAGNOSIS — O99019 Anemia complicating pregnancy, unspecified trimester: Secondary | ICD-10-CM

## 2021-05-11 DIAGNOSIS — O99810 Abnormal glucose complicating pregnancy: Secondary | ICD-10-CM

## 2021-05-11 DIAGNOSIS — O99013 Anemia complicating pregnancy, third trimester: Secondary | ICD-10-CM

## 2021-05-11 DIAGNOSIS — Z3483 Encounter for supervision of other normal pregnancy, third trimester: Secondary | ICD-10-CM

## 2021-05-11 NOTE — Telephone Encounter (Signed)
Client with c/o swollen feet and hands and states now painful to walk. Desires appt and scheduled for this pm. Denies headache or any vision changes (denies seeing spots, lines, floaters). Salli Real interpreted during call. Jossie Ng, RN

## 2021-05-11 NOTE — Telephone Encounter (Signed)
Patient missed the call back

## 2021-05-11 NOTE — Telephone Encounter (Signed)
Patient states that her feet are really swollen and it hurts to walk. She asked if she could be seen before her next scheduled appointment.

## 2021-05-11 NOTE — Progress Notes (Signed)
   PRENATAL VISIT NOTE  Subjective:  Nicole Rocha is a 20 y.o. G2P0010 at [redacted]w[redacted]d being seen today for ongoing prenatal care.  She is currently monitored for the following issues for this low-risk pregnancy and has Supervision of other normal pregnancy, antepartum; Abnormal Pap smear of cervix 11/26/20 LSIL; Anemia affecting pregnancy; Abnormal glucose affecting pregnancy - Equivocal 3 hGTT; and Labor and delivery, indication for care on their problem list.  Patient reports  edema .   .  .  Movement: Present. Denies leaking of fluid/ROM.   The following portions of the patient's history were reviewed and updated as appropriate: allergies, current medications, past family history, past medical history, past social history, past surgical history and problem list. Problem list updated.  Objective:   Vitals:   05/11/21 1505  BP: 111/73  Pulse: (!) 107  Temp: (!) 97.4 F (36.3 C)  Weight: 202 lb 9.6 oz (91.9 kg)    Fetal Status:   Fundal Height: 36 cm Movement: Present     General:  Alert, oriented and cooperative. Patient is in no acute distress.  Skin: Skin is warm and dry. No rash noted.   Cardiovascular: Normal heart rate noted  Respiratory: Normal respiratory effort, no problems with respiration noted  Abdomen: Soft, gravid, appropriate for gestational age.        Pelvic: Cervical exam deferred        Extremities: Normal range of motion.  Edema: None  Mental Status: Normal mood and affect. Normal behavior. Normal judgment and thought content.   Assessment and Plan:  Pregnancy: G2P0010 at [redacted]w[redacted]d  1. Supervision of other normal pregnancy, antepartum Here with friend and c/o edema. No pretibial edema but suggestions given. Denies h/a, scotoma. U/a today=trace proteinuria 3 lb wt loss in past 3 days. 24 lb 9.6 oz (11.2 kg) States eating and has food at home. BP wnl 111/73  2. Anemia affecting pregnancy, antepartum Not taking FeSo4  because says they gave her diarrhea and  made her vomit Hgb today=12.5; pt dehydrated and counseled to drink 1 c. Water q hour while awake - Hemoglobin, venipuncture - Urinalysis (Urine Dip)  3. Abnormal glucose affecting pregnancy - Equivocal 3 hGTT 3 hour GTT equivocal on 04/01/21 and pt hasn't answered phone when dietician called her on multiple occassions. At last apt pt was given dietician's # to call her, but she hasn't yet; pt promised to do so today for ADA diet counseling    Preterm labor symptoms and general obstetric precautions including but not limited to vaginal bleeding, contractions, leaking of fluid and fetal movement were reviewed in detail with the patient. Please refer to After Visit Summary for other counseling recommendations.  No follow-ups on file.  Future Appointments  Date Time Provider Department Center  05/22/2021  3:40 PM [facility] PROVIDER [facility] None    Alberteen Spindle, CNM

## 2021-05-11 NOTE — Progress Notes (Signed)
Urine dip reviewed by E. Sciora CNM as was hgb = 12.5 Per Ms. Sciora CNM, counsel client to increase water to at least 8 - 10 measuring cups daily. Client verbalized understanding. Jossie Ng, RN

## 2021-05-12 LAB — URINALYSIS
Bilirubin, UA: NEGATIVE
Glucose, UA: NEGATIVE
Ketones, UA: NEGATIVE
Leukocytes,UA: NEGATIVE
Nitrite, UA: NEGATIVE
RBC, UA: NEGATIVE
Specific Gravity, UA: 1.025 (ref 1.005–1.030)
Urobilinogen, Ur: 1 mg/dL (ref 0.2–1.0)
pH, UA: 7.5 (ref 5.0–7.5)

## 2021-05-12 LAB — HEMOGLOBIN, FINGERSTICK: Hemoglobin: 12.4 g/dL (ref 11.1–15.9)

## 2021-05-14 ENCOUNTER — Telehealth: Payer: Self-pay | Admitting: Family Medicine

## 2021-05-14 NOTE — Telephone Encounter (Signed)
Contacted patient regarding message of sore throat.   Informed patient that for sore throat she can take Cepacol throat lozenges, chloraseptic throat spray, and cough drops that are alcohol free. Reminded patient that she has the list of safe medications in pregnancy in her blue bag we gave her. Patient states she has no other symptoms other than sore throat. Suggested patient to also gargle rinse with warm water and salt.   Patient states understating and will try these remedies.   Floy Sabina, RN

## 2021-05-14 NOTE — Telephone Encounter (Signed)
Patient's throat hurts and can barely eat. Would like to know if she can take anything for that.

## 2021-05-22 ENCOUNTER — Ambulatory Visit: Payer: Self-pay

## 2021-05-22 ENCOUNTER — Ambulatory Visit: Payer: Self-pay | Admitting: Advanced Practice Midwife

## 2021-05-22 ENCOUNTER — Other Ambulatory Visit: Payer: Self-pay

## 2021-05-22 VITALS — BP 103/58 | HR 80 | Temp 97.6°F | Wt 205.0 lb

## 2021-05-22 DIAGNOSIS — Z3480 Encounter for supervision of other normal pregnancy, unspecified trimester: Secondary | ICD-10-CM

## 2021-05-22 DIAGNOSIS — B379 Candidiasis, unspecified: Secondary | ICD-10-CM

## 2021-05-22 DIAGNOSIS — Z3483 Encounter for supervision of other normal pregnancy, third trimester: Secondary | ICD-10-CM

## 2021-05-22 DIAGNOSIS — O99013 Anemia complicating pregnancy, third trimester: Secondary | ICD-10-CM

## 2021-05-22 DIAGNOSIS — O99810 Abnormal glucose complicating pregnancy: Secondary | ICD-10-CM

## 2021-05-22 DIAGNOSIS — O99019 Anemia complicating pregnancy, unspecified trimester: Secondary | ICD-10-CM

## 2021-05-22 LAB — OB RESULTS CONSOLE GC/CHLAMYDIA
Chlamydia: NEGATIVE
Gonorrhea: NEGATIVE

## 2021-05-22 LAB — WET PREP FOR TRICH, YEAST, CLUE: Trichomonas Exam: NEGATIVE

## 2021-05-22 MED ORDER — CLOTRIMAZOLE 1 % VA CREA: 1.0000 | TOPICAL_CREAM | Freq: Every day | VAGINAL | 0 refills | Status: DC

## 2021-05-22 NOTE — Progress Notes (Signed)
Wet Prep reviewed during clinic visit. Treatment given for yeast per SO.  All questions answered. Appointment reminder card given for next MH RV.    Floy Sabina, RN

## 2021-05-22 NOTE — Progress Notes (Signed)
   PRENATAL VISIT NOTE  Subjective:  Nicole Rocha is a 20 y.o. G2P0010 at [redacted]w[redacted]d being seen today for ongoing prenatal care.  She is currently monitored for the following issues for this low-risk pregnancy and has Supervision of other normal pregnancy, antepartum; Abnormal Pap smear of cervix 11/26/20 LSIL; Anemia affecting pregnancy; and Abnormal glucose affecting pregnancy - Equivocal 3 hGTT on their problem list.  Patient reports  malodor and vaginal burning x 2 wks .  Contractions: Not present. Vag. Bleeding: None.  Movement: Present. Denies leaking of fluid/ROM.   The following portions of the patient's history were reviewed and updated as appropriate: allergies, current medications, past family history, past medical history, past social history, past surgical history and problem list. Problem list updated.  Objective:   Vitals:   05/22/21 1549  BP: (!) 103/58  Pulse: 80  Temp: 97.6 F (36.4 C)  Weight: 205 lb (93 kg)    Fetal Status: Fetal Heart Rate (bpm): 140 Fundal Height: 38 cm Movement: Present  Presentation: Vertex  General:  Alert, oriented and cooperative. Patient is in no acute distress.  Skin: Skin is warm and dry. No rash noted.   Cardiovascular: Normal heart rate noted  Respiratory: Normal respiratory effort, no problems with respiration noted  Abdomen: Soft, gravid, appropriate for gestational age.  Pain/Pressure: Absent     Pelvic: Cervical exam deferred        Extremities: Normal range of motion.  Edema: None  Mental Status: Normal mood and affect. Normal behavior. Normal judgment and thought content.   Assessment and Plan:  Pregnancy: G2P0010 at [redacted]w[redacted]d  1. Supervision of other normal pregnancy, antepartum C/o vaginal malodor and burning x 2 wks; wet mount done with erythematous vagina, white clumpy leukorrhea, ph<4.5 GC/Chlamydia/GBS cultures done Knows when to go to L&D Has car seat and ready for baby at home - Culture, beta strep (group b  only) - Chlamydia/GC NAA, Confirmation - WET PREP FOR TRICH, YEAST, CLUE  2. Anemia affecting pregnancy, antepartum Hgb=12.4 on 05/11/21  3. Abnormal glucose affecting pregnancy - Equivocal 3 hGTT Equivocal 3 hr GTT 04/01/21. Pt has not had ADA diet counseling done yet. Pt did not answer numerous t/c from dietician so at last apt pt given Sarah's # and promised to call--she did not. # given again and pt promises to call her   Preterm labor symptoms and general obstetric precautions including but not limited to vaginal bleeding, contractions, leaking of fluid and fetal movement were reviewed in detail with the patient. Please refer to After Visit Summary for other counseling recommendations.  Return in about 1 week (around 05/29/2021) for routine PNC.  No future appointments.  Alberteen Spindle, CNM

## 2021-05-25 IMAGING — US US OB COMP LESS 14 WK
1 series · 13 of 24 positions shown · non-contrast
Comparison: Ultrasound Ob 10/16/2020

CLINICAL DATA: Cramping. Beta HCG quantitative 230,959 (from 5,167
on 10/16/20). Last menstrual period 09/04/2020. Gestational age by
last menstrual period 10 weeks and 3 days. Due date by last
menstrual period 06/11/2021.

EXAM:
OBSTETRIC <14 WK US AND TRANSVAGINAL OB US
TECHNIQUE: Both transabdominal and transvaginal ultrasound examinations were
performed for complete evaluation of the gestation as well as the
maternal uterus, adnexal regions, and pelvic cul-de-sac.
Transvaginal technique was performed to assess early pregnancy.

[Series 1: us ob comp less 14 wks · 13 of 24 slices shown]
[im 1/24]
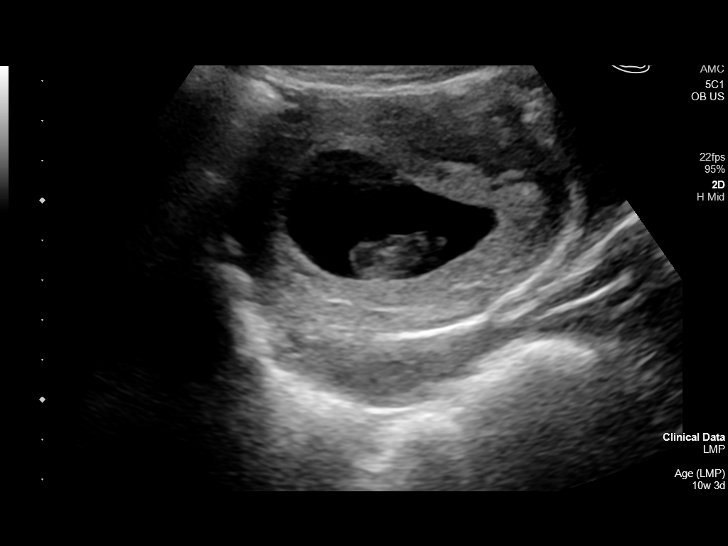
[im 3/24]
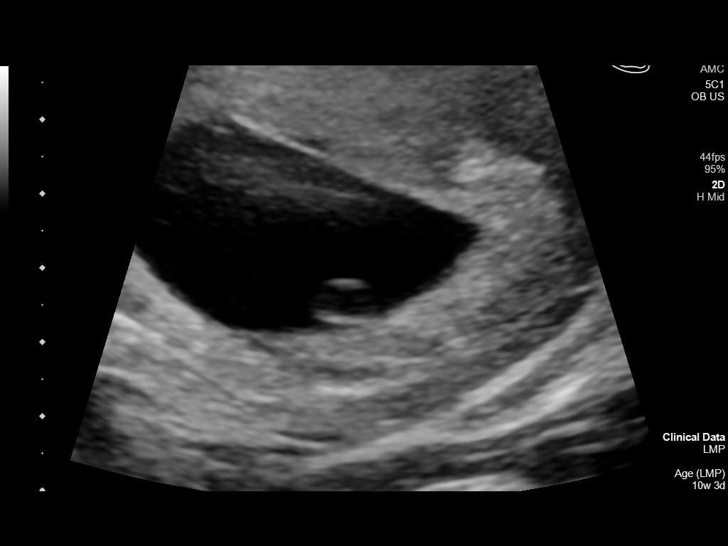
[im 5/24]
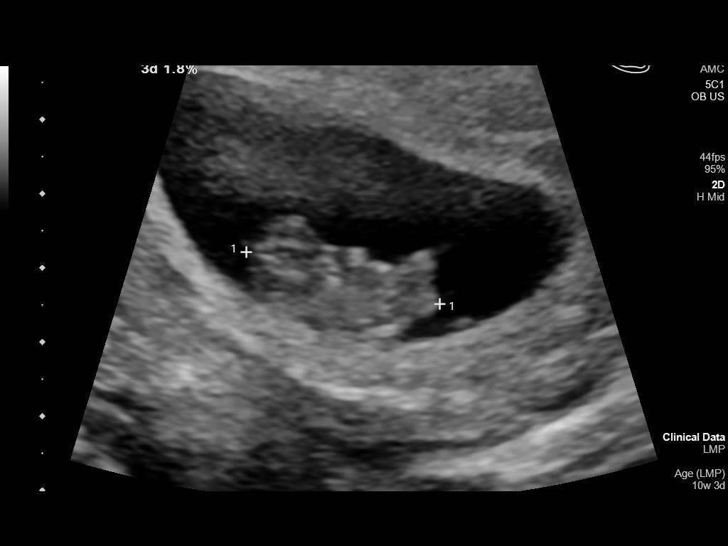
[im 7/24]
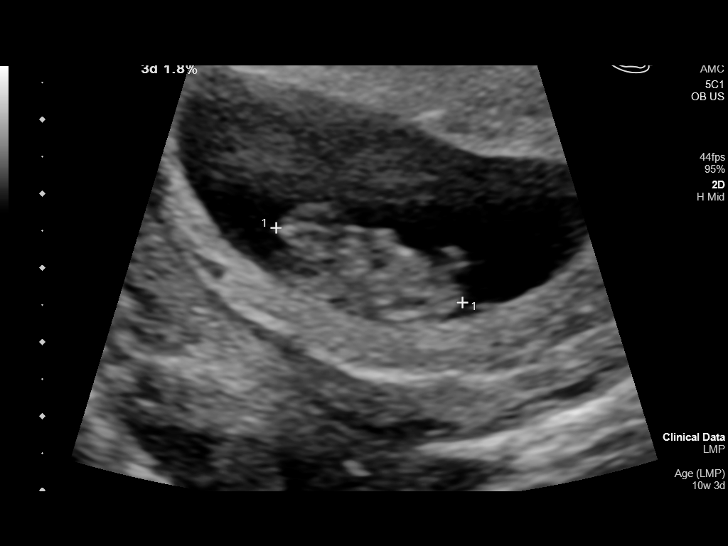
[im 9/24]
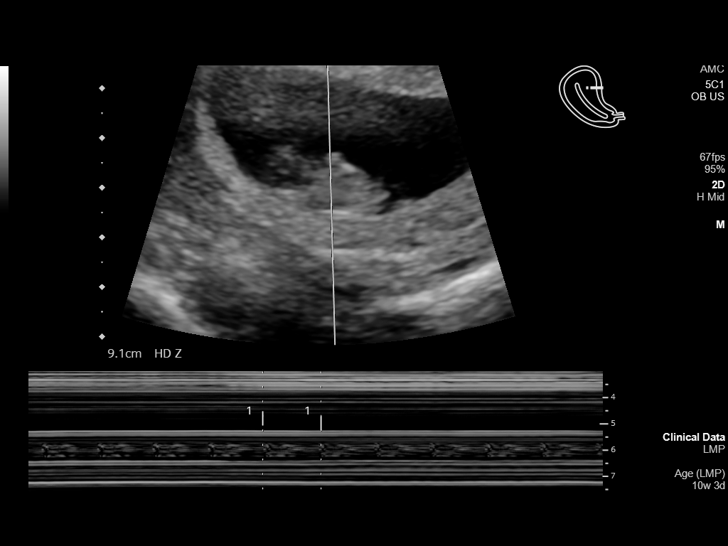
[im 11/24]
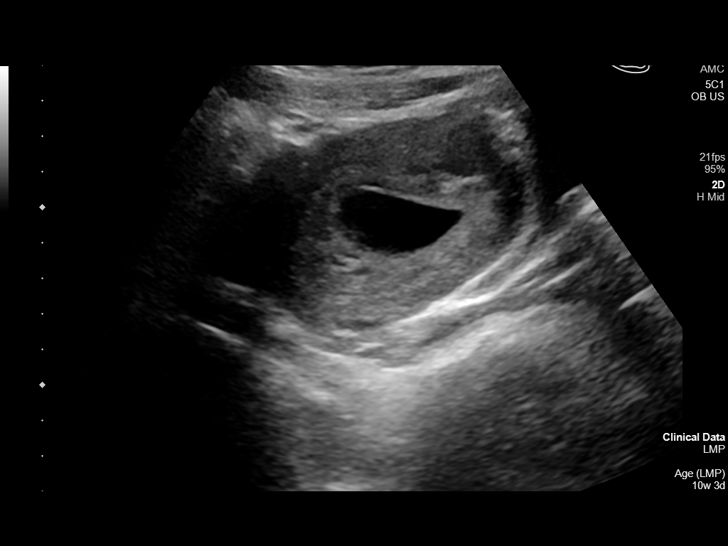
[im 13/24]
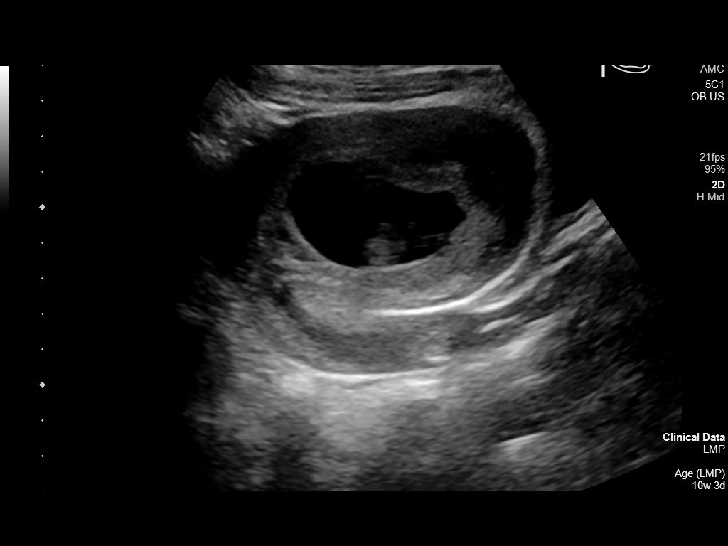
[im 14/24]
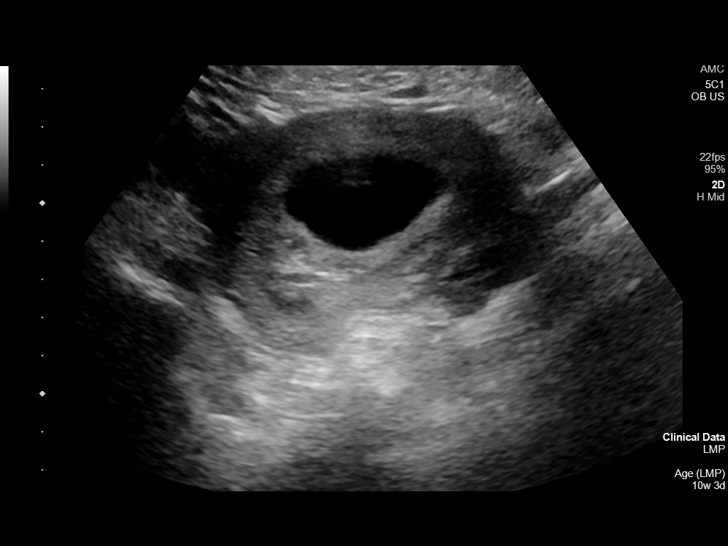
[im 16/24]
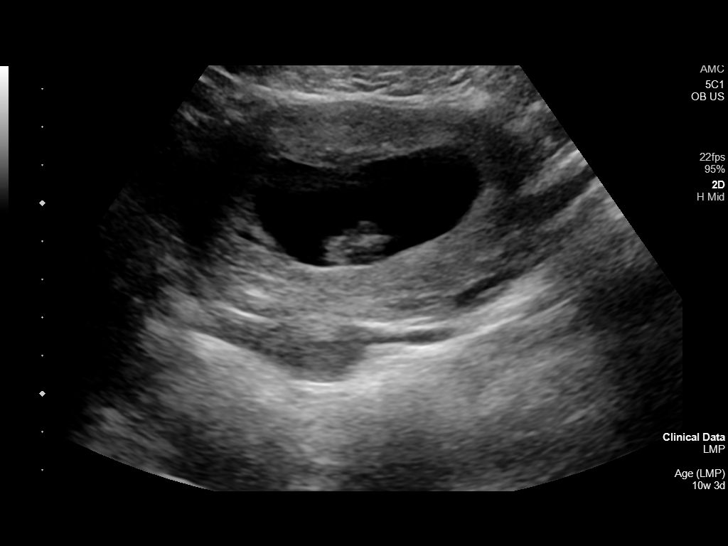
[im 18/24]
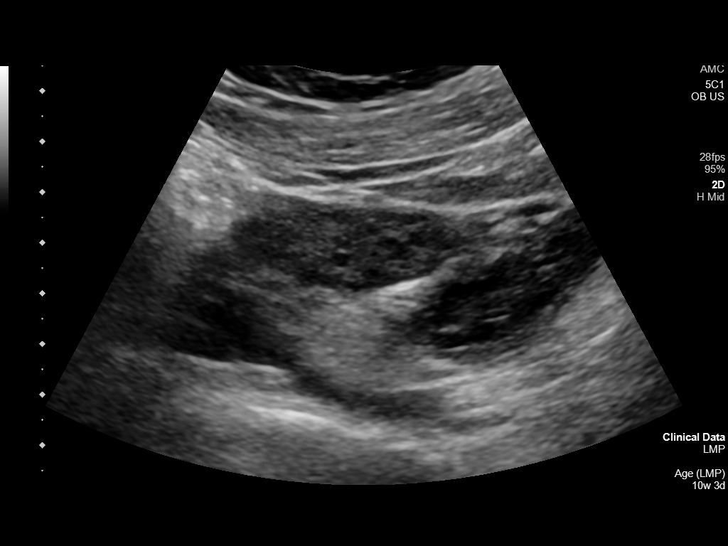
[im 20/24]
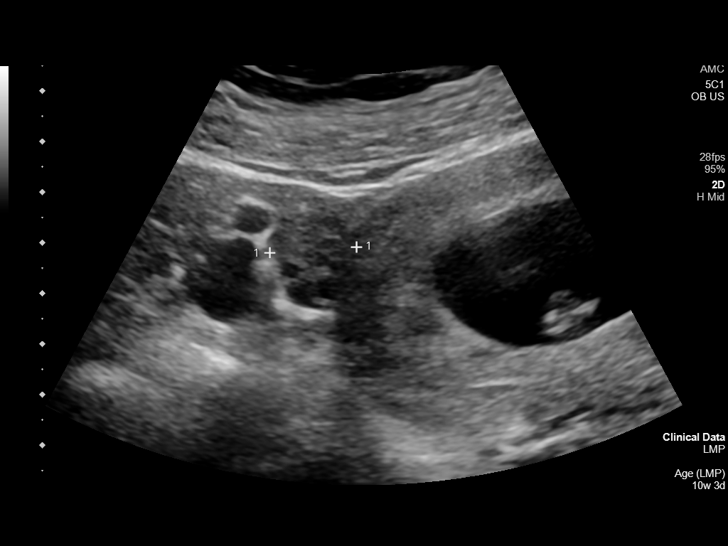
[im 22/24]
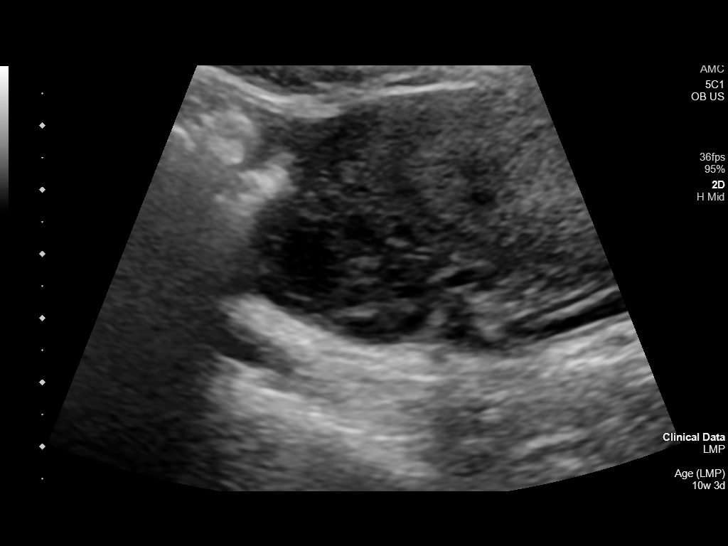
[im 24/24]
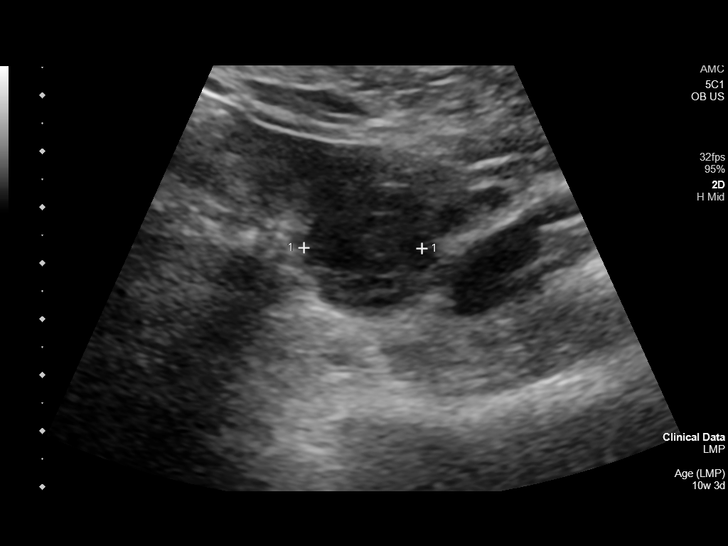

[13 of 24 positions shown; findings below may reference images not displayed]

FINDINGS: Intrauterine gestational sac: Single

Yolk sac:  Visualized.

Embryo:  Visualized.

Cardiac Activity: Visualized.

Heart Rate: 176 bpm

CRL:  27.3 mm   9 w   4 d                  US EDC: 06/17/2021

Subchorionic hemorrhage:  None visualized.

Maternal uterus/adnexae: The right ovary is unremarkable. The right
ovary measures 4.1 x 1.7 x 1.7 cm. The left ovary is unremarkable
and measures 3.1 x 1.8 x 2.1 cm. Corpus luteum cyst noted within the
left ovary.

Other: No free pelvic fluid.
IMPRESSION: Single live intrauterine pregnancy with gestational age of 9 weeks
and 4 days that is concordant with gestational age by last menstrual
period of 10 weeks and 3 days.

## 2021-05-26 LAB — CULTURE, BETA STREP (GROUP B ONLY): Strep Gp B Culture: NEGATIVE

## 2021-05-26 LAB — CHLAMYDIA/GC NAA, CONFIRMATION
Chlamydia trachomatis, NAA: NEGATIVE
Neisseria gonorrhoeae, NAA: NEGATIVE

## 2021-05-27 ENCOUNTER — Telehealth: Payer: Self-pay | Admitting: Family Medicine

## 2021-05-27 ENCOUNTER — Inpatient Hospital Stay
Admission: EM | Admit: 2021-05-27 | Discharge: 2021-05-27 | Disposition: A | Discharge status: Home / Self Care | Payer: Medicaid Other | Attending: Obstetrics & Gynecology | Admitting: Obstetrics & Gynecology

## 2021-05-27 ENCOUNTER — Other Ambulatory Visit: Payer: Self-pay

## 2021-05-27 ENCOUNTER — Encounter: Payer: Self-pay | Admitting: Obstetrics & Gynecology

## 2021-05-27 DIAGNOSIS — O99810 Abnormal glucose complicating pregnancy: Secondary | ICD-10-CM

## 2021-05-27 DIAGNOSIS — Z3A37 37 weeks gestation of pregnancy: Secondary | ICD-10-CM | POA: Insufficient documentation

## 2021-05-27 DIAGNOSIS — R1030 Lower abdominal pain, unspecified: Secondary | ICD-10-CM

## 2021-05-27 DIAGNOSIS — O99891 Other specified diseases and conditions complicating pregnancy: Secondary | ICD-10-CM

## 2021-05-27 DIAGNOSIS — O471 False labor at or after 37 completed weeks of gestation: Secondary | ICD-10-CM | POA: Diagnosis not present

## 2021-05-27 LAB — URINALYSIS, COMPLETE (UACMP) WITH MICROSCOPIC
Bilirubin Urine: NEGATIVE
Glucose, UA: NEGATIVE mg/dL
Hgb urine dipstick: NEGATIVE
Ketones, ur: NEGATIVE mg/dL
Leukocytes,Ua: NEGATIVE
Nitrite: NEGATIVE
Protein, ur: NEGATIVE mg/dL
Specific Gravity, Urine: 1.02 (ref 1.005–1.030)
pH: 7 (ref 5.0–8.0)

## 2021-05-27 NOTE — OB Triage Note (Signed)
Patient arrived in triage with c/o contractions. Video interpreter Renaldo Fiddler [phone] utilized to answer questions. Patient reports contractions started yesterday and got worse around 2pm today. Report some vaginal discharge, but denies leaking of fluid or vaginal bleeding. Good fetal movement noted. Patient reports pain is an 8 in her back and comes and goes, but also notes pain on the ride side of her abdomen that she describes as sharp and rates 7/10. Patient also notes a burning sensation when urinating for about 3 days. Fetal monitors applied and assessing.

## 2021-05-27 NOTE — Discharge Summary (Signed)
  See FPN 

## 2021-05-27 NOTE — Telephone Encounter (Signed)
Pt with back pain, she thinks she is having contractions and needs advice on what to do next. Please call her back ASAP. Needs interpreter in Spanish. Tx

## 2021-05-27 NOTE — Telephone Encounter (Signed)
EGA = 37 0/7. Client reports onset of lower back pain last pm around 6 - 7 pm. States back pain has worsened during the day and rates pain now s an "8". Reports having contractions that last maybe 3 minutes maybe every 15 - 20 minutes. Reports increase today in brownish coffee colored vaginal discharge. States baby is moving, but not as much as usual. Per consult with E. Sciora CNM, needs L & D evaluation. Client so counseled and in agreement with plan. Jossie Ng, RN

## 2021-05-27 NOTE — Final Progress Note (Signed)
Physician Final Progress Note  Patient ID: Nicole Rocha MRN: [id number] DOB/AGE: 03-Oct-2001 20 y.o.  Admit date: 05/27/2021 Admitting provider: Nadara Mustard, MD Discharge date: 05/27/2021   Admission Diagnoses: Lower abdominal pain in pregnancy 37 weeks pregnancy  Discharge Diagnoses: same  Consults: None  Significant Findings/ Diagnostic Studies: Patient presented for evaluation of labor.  Patient had cervical exam by RN and this was reported to me. I reviewed her vital signs and fetal tracing, both of which were reassuring.  Patient was discharge as she was not laboring.   Procedures: A NST procedure was performed with FHR monitoring and a normal baseline established, appropriate time of 20-40 minutes of evaluation, and accels >2 seen w 15x15 characteristics.  Results show a REACTIVE NST.   UA Neg  Discharge Condition: good  Disposition: Discharge disposition: 01-Home or Self Care       Diet: Regular diet  Discharge Activity: Activity as tolerated  Discharge Instructions     Call MD for:   Complete by: As directed    Worsening contractions or pain; leakage of fluid; bleeding.   Diet - low sodium heart healthy   Complete by: As directed    Increase activity slowly   Complete by: As directed       Allergies as of 05/27/2021   No Known Allergies      Medication List     STOP taking these medications    clotrimazole 1 % vaginal cream Commonly known as: Clotrimazole-7   ferrous sulfate 325 (65 FE) MG tablet       TAKE these medications    Prenatal Vitamin 27-0.8 MG Tabs Take 1 tablet by mouth daily at 6 (six) AM.         Total time spent taking care of this patient: TRIAGE  Signed: Letitia Libra 05/27/2021, 11:21 PM

## 2021-05-27 NOTE — Telephone Encounter (Signed)
[employer] ID # [id number] assisted with call. Jossie Ng, RN

## 2021-05-28 DIAGNOSIS — O471 False labor at or after 37 completed weeks of gestation: Secondary | ICD-10-CM | POA: Diagnosis not present

## 2021-05-28 NOTE — OB Triage Note (Signed)
Patient given discharge paperwork and additional discharge instructions as per AVS utilizing interpreter Izora Gala [phone]. Patient verbalized understanding and agreed to plan. Discharged in stable condition accompanied by family member.

## 2021-05-29 ENCOUNTER — Ambulatory Visit: Payer: Self-pay | Admitting: Family Medicine

## 2021-05-29 ENCOUNTER — Other Ambulatory Visit: Payer: Self-pay

## 2021-05-29 VITALS — BP 111/67 | HR 98 | Temp 97.4°F | Wt 206.6 lb

## 2021-05-29 DIAGNOSIS — Z3483 Encounter for supervision of other normal pregnancy, third trimester: Secondary | ICD-10-CM

## 2021-05-29 DIAGNOSIS — Z3480 Encounter for supervision of other normal pregnancy, unspecified trimester: Secondary | ICD-10-CM

## 2021-06-01 NOTE — Progress Notes (Signed)
   PRENATAL VISIT NOTE  Subjective:  Nicole Rocha is a 20 y.o. G2P0010 at [redacted]w[redacted]d being seen today for ongoing prenatal care.  She is currently monitored for the following issues for this low-risk pregnancy and has Supervision of other normal pregnancy, antepartum; Abnormal Pap smear of cervix 11/26/20 LSIL; Anemia affecting pregnancy; and Abnormal glucose affecting pregnancy - Equivocal 3 hGTT on their problem list.  Patient reports  ER visit on 05/27/21 .  Contractions: Irritability. Vag. Bleeding: None.  Movement: Present. Denies leaking of fluid/ROM.   The following portions of the patient's history were reviewed and updated as appropriate: allergies, current medications, past family history, past medical history, past social history, past surgical history and problem list. Problem list updated.  Objective:   Vitals:   05/29/21 1539  BP: 111/67  Pulse: 98  Temp: (!) 97.4 F (36.3 C)  Weight: 206 lb 9.6 oz (93.7 kg)    Fetal Status: Fetal Heart Rate (bpm): 152 Fundal Height: 38 cm Movement: Present     General:  Alert, oriented and cooperative. Patient is in no acute distress.  Skin: Skin is warm and dry. No rash noted.   Cardiovascular: Normal heart rate noted  Respiratory: Normal respiratory effort, no problems with respiration noted  Abdomen: Soft, gravid, appropriate for gestational age.  Pain/Pressure: Absent     Pelvic: Cervical exam deferred        Extremities: Normal range of motion.  Edema: None  Mental Status: Normal mood and affect. Normal behavior. Normal judgment and thought content.   Assessment and Plan:  Pregnancy: G2P0010 at [redacted]w[redacted]d  1. Supervision of other normal pregnancy, antepartum Discussed ER visit on 05/27/21 for Lower abdominal pain,  patient though in labor- per L&D evaluation, pt not in labor and reassuring NST. - pt denies any contractions since L& D visit,  - reviewed 36 week lab results - GBS neg.  -Reviewed how to get in touch with [facility]   hospital, discussed when to call. -Ready for baby at home, -Reviewed breastfeeding support at [facility] and in [facility] clinic.  -Reviewed contraceptive plans and patient still desires IUD   Term labor symptoms and general obstetric precautions including but not limited to vaginal bleeding, contractions, leaking of fluid and fetal movement were reviewed in detail with the patient. Please refer to After Visit Summary for other counseling recommendations.  No follow-ups on file.  Future Appointments  Date Time Provider Department Center  06/05/2021  2:00 PM [facility] PROVIDER [facility] None   V. Olmedo used for [place] interpretation.     Wendi Snipes, FNP

## 2021-06-05 ENCOUNTER — Ambulatory Visit: Payer: Self-pay | Admitting: Family Medicine

## 2021-06-05 VITALS — BP 116/71 | HR 96 | Temp 98.1°F | Wt 212.0 lb

## 2021-06-05 DIAGNOSIS — O99013 Anemia complicating pregnancy, third trimester: Secondary | ICD-10-CM

## 2021-06-05 DIAGNOSIS — Z3480 Encounter for supervision of other normal pregnancy, unspecified trimester: Secondary | ICD-10-CM

## 2021-06-05 DIAGNOSIS — O99019 Anemia complicating pregnancy, unspecified trimester: Secondary | ICD-10-CM

## 2021-06-05 DIAGNOSIS — Z3483 Encounter for supervision of other normal pregnancy, third trimester: Secondary | ICD-10-CM

## 2021-06-07 NOTE — Progress Notes (Signed)
   PRENATAL VISIT NOTE  Subjective:  Nicole Rocha is a 20 y.o. G2P0010 at [redacted]w[redacted]d being seen today for ongoing prenatal care.  She is currently monitored for the following issues for this low-risk pregnancy and has Supervision of other normal pregnancy, antepartum; Abnormal Pap smear of cervix 11/26/20 LSIL; Anemia affecting pregnancy; and Abnormal glucose affecting pregnancy - Equivocal 3 hGTT on their problem list.  Patient reports  pelvis pressure .  Contractions: Irritability. Vag. Bleeding: None.  Movement: Present. Denies leaking of fluid/ROM.   The following portions of the patient's history were reviewed and updated as appropriate: allergies, current medications, past family history, past medical history, past social history, past surgical history and problem list. Problem list updated.  Objective:   Vitals:   06/05/21 1411  BP: 116/71  Pulse: 96  Temp: 98.1 F (36.7 C)  Weight: 212 lb (96.2 kg)    Fetal Status: Fetal Heart Rate (bpm): 150 Fundal Height: 40 cm Movement: Present     General:  Alert, oriented and cooperative. Patient is in no acute distress.  Skin: Skin is warm and dry. No rash noted.   Cardiovascular: Normal heart rate noted  Respiratory: Normal respiratory effort, no problems with respiration noted  Abdomen: Soft, gravid, appropriate for gestational age.  Pain/Pressure: Absent     Pelvic: Cervical exam deferred        Extremities: Normal range of motion.  Edema: Trace  Mental Status: Normal mood and affect. Normal behavior. Normal judgment and thought content.   Assessment and Plan:  Pregnancy: G2P0010 at [redacted]w[redacted]d  1. Supervision of other normal pregnancy, antepartum - anticipated guidance 39 week visit exam for IOL paperwork.   -some exercise, but feels more tired -taking PNV as directed  2. Anemia affecting pregnancy, antepartum Last Hgb was 12.4 , pt not taking FeSo4    Term labor symptoms and general obstetric precautions including but not  limited to vaginal bleeding, contractions, leaking of fluid and fetal movement were reviewed in detail with the patient. Please refer to After Visit Summary for other counseling recommendations.  Return in about 1 week (around 06/12/2021) for routine prenatal care.  Future Appointments  Date Time Provider Department Center  06/12/2021  2:40 PM [facility] PROVIDER [facility] None   Language line Joni Reining [phone]) used for Spanish interpretation.     Wendi Snipes, FNP

## 2021-06-12 ENCOUNTER — Ambulatory Visit: Payer: Self-pay | Admitting: Advanced Practice Midwife

## 2021-06-12 ENCOUNTER — Other Ambulatory Visit: Payer: Self-pay

## 2021-06-12 VITALS — BP 104/67 | HR 98 | Temp 97.5°F | Wt 210.2 lb

## 2021-06-12 DIAGNOSIS — Z3483 Encounter for supervision of other normal pregnancy, third trimester: Secondary | ICD-10-CM

## 2021-06-12 DIAGNOSIS — Z3480 Encounter for supervision of other normal pregnancy, unspecified trimester: Secondary | ICD-10-CM

## 2021-06-12 DIAGNOSIS — O99810 Abnormal glucose complicating pregnancy: Secondary | ICD-10-CM

## 2021-06-12 DIAGNOSIS — O99013 Anemia complicating pregnancy, third trimester: Secondary | ICD-10-CM

## 2021-06-12 DIAGNOSIS — O99019 Anemia complicating pregnancy, unspecified trimester: Secondary | ICD-10-CM

## 2021-06-12 NOTE — Progress Notes (Signed)
   PRENATAL VISIT NOTE  Subjective:  Nicole Rocha is a 20 y.o. G2P0010 at [redacted]w[redacted]d being seen today for ongoing prenatal care.  She is currently monitored for the following issues for this low-risk pregnancy and has Supervision of other normal pregnancy, antepartum; Abnormal Pap smear of cervix 11/26/20 LSIL; Anemia affecting pregnancy; and Abnormal glucose affecting pregnancy - Equivocal 3 hGTT on their problem list.  Patient reports  insomnia .  Contractions: Not present. Vag. Bleeding: None.  Movement: Present.  Denies leaking of fluid/ROM.   The following portions of the patient's history were reviewed and updated as appropriate: allergies, current medications, past family history, past medical history, past social history, past surgical history and problem list. Problem list updated.  Objective:   Vitals:   06/12/21 1450  BP: 104/67  Pulse: 98  Temp: (!) 97.5 F (36.4 C)  Weight: 210 lb 3.2 oz (95.3 kg)    Fetal Status: Fetal Heart Rate (bpm): 140 Fundal Height: 40 cm Movement: Present  Presentation: Vertex  General:  Alert, oriented and cooperative. Patient is in no acute distress.  Skin: Skin is warm and dry. No rash noted.   Cardiovascular: Normal heart rate noted  Respiratory: Normal respiratory effort, no problems with respiration noted  Abdomen: Soft, gravid, appropriate for gestational age.  Pain/Pressure: Absent     Pelvic: Cervical exam performed Dilation: 1 Effacement (%): 90 Station: -2  Extremities: Normal range of motion.  Edema: None  Mental Status: Normal mood and affect. Normal behavior. Normal judgment and thought content.   Assessment and Plan:  Pregnancy: G2P0010 at [redacted]w[redacted]d  1. Supervision of other normal pregnancy, antepartum Knows when to go to L&D Has car seat.  Pt desires IOL 06/27/21; IOL paperwork completed Suggestions given for insomnia Pt crying because troubles with her 31 yo brother  2. Anemia affecting pregnancy, antepartum Last Hgb  12.4 on 05/11/21; not taking FeSo4  3. Abnormal glucose affecting pregnancy - Equivocal 3 hGTT MNT counseling done   Term labor symptoms and general obstetric precautions including but not limited to vaginal bleeding, contractions, leaking of fluid and fetal movement were reviewed in detail with the patient. Please refer to After Visit Summary for other counseling recommendations.  No follow-ups on file.  No future appointments.  Alberteen Spindle, CNM

## 2021-06-12 NOTE — Progress Notes (Signed)
IOL form faxed to [facility] on 06/12/21 with confirmation.   Floy Sabina, RN

## 2021-06-17 NOTE — H&P (Signed)
OB History & Physical   History of Present Illness:  Date of initial H&P: 06/17/2021  Chief Complaint: Induction of labor for postdates on 06/27/21 at 8 AM with Covid swab to be done 06/25/21. Inst[facility] to [facility].  HPI:  Nicole Rocha is a 20 y.o. G2P0010 female at [redacted]w[redacted]d dated by 9 week ultrasound.  Her pregnancy has been complicated by abnormal PAP smear LGSIL, anemia, abnormal blood glucose result with passing 3 hr gtt .    She denies contractions.   She denies leakage of fluid.   She denies vaginal bleeding.   She reports fetal movement.    Total weight gain for pregnancy: 32 lb 3.2 oz (14.6 kg)   Obstetrical Problem List: Pregnancy 2022 Problems (from 11/25/20 to present)     Problem Noted Resolved   Abnormal glucose affecting pregnancy - Equivocal 3 hGTT 03/27/2021 by Alberteen Spindle, CNM No   Overview Addendum 05/12/2021  5:49 PM by Alberteen Spindle, CNM    1 hour glucola=162 03/26/21 3 hour GTT=04/01/21=equivocal--needs ADA diet counseling (scheduled on 04/02/21) Pt states she has not had MNT as of 04/23/21, resent referral=done 05/12/21           Maternal Medical History:   Past Medical History:  Diagnosis Date   Abnormal Pap smear of cervix 11/26/20 LSIL 03/11/2021    Past Surgical History:  Procedure Laterality Date   NO PAST SURGERIES      No Known Allergies  Prior to Admission medications   Medication Sig Start Date End Date Taking? Authorizing Provider  Prenatal Vit-Fe Fumarate-FA (PRENATAL VITAMIN) 27-0.8 MG TABS Take 1 tablet by mouth daily at 6 (six) AM. 02/11/21  Yes Federico Flake, MD    OB History  Gravida Para Term Preterm AB Living  2 0 0 0 1 0  SAB IAB Ectopic Multiple Live Births  1 0 0 0 0    # Outcome Date GA Lbr Len/2nd Weight Sex Delivery Anes PTL Lv  2 Current           1 SAB  [redacted]w[redacted]d           Prenatal care site: [facility]  Social History: She  reports that she has never smoked. She has never been exposed to  tobacco smoke. She has never used smokeless tobacco. She reports that she does not currently use alcohol. She reports that she does not currently use drugs.  Family History: family history includes Healthy in her brother, brother, father, and mother.    Review of Systems:  Review of Systems  Constitutional:  Negative for chills and fever.       Positive for insomnia  HENT:  Negative for congestion, ear discharge, ear pain, hearing loss, sinus pain and sore throat.   Eyes:  Negative for blurred vision and double vision.  Respiratory:  Negative for cough, shortness of breath and wheezing.   Cardiovascular:  Negative for chest pain, palpitations and leg swelling.  Gastrointestinal:  Negative for abdominal pain, blood in stool, constipation, diarrhea, heartburn, melena, nausea and vomiting.  Genitourinary:  Negative for dysuria, flank pain, frequency, hematuria and urgency.  Musculoskeletal:  Negative for back pain, joint pain and myalgias.  Skin:  Negative for itching and rash.  Neurological:  Negative for dizziness, tingling, tremors, sensory change, speech change, focal weakness, seizures, loss of consciousness, weakness and headaches.  Endo/Heme/Allergies:  Negative for environmental allergies. Does not bruise/bleed easily.  Psychiatric/Behavioral:  Negative for depression, hallucinations, memory loss, substance abuse and  suicidal ideas. The patient is not nervous/anxious and does not have insomnia.     Physical Exam:  BP 104/67   Pulse 98   Temp (!) 97.5 F (36.4 C)   Wt 210 lb 3.2 oz (95.3 kg)   LMP 09/04/2020   BMI 33.93 kg/m   Exam done at [facility] during prenatal visit. Please see physical exam from 06/12/21 progress note. Fetal Heart Tones: 140 Cervical Exam: 1/90/-2   Lab Results  Component Value Date   SARSCOV2NAA NEGATIVE 02/13/2021  Current result pending  Assessment:  Nicole Rocha is a 20 y.o. G2P0010 female at [redacted]w[redacted]d with induction of labor for  postdates.   Plan:  Admit to Labor & Delivery  CBC, T&S, Clrs, IVF GBS negative.   Fetal well-being: reassuring Begin induction based on admission exam  Tresea Mall, CNM 06/17/2021 1:31 PM

## 2021-06-17 NOTE — Addendum Note (Signed)
Addended by: Tresea Mall on: 06/17/2021 01:39 PM   Modules accepted: Orders, SmartSet

## 2021-06-19 ENCOUNTER — Ambulatory Visit: Payer: Self-pay | Admitting: Advanced Practice Midwife

## 2021-06-19 ENCOUNTER — Other Ambulatory Visit: Payer: Self-pay

## 2021-06-19 VITALS — BP 118/66 | HR 76 | Temp 97.5°F | Wt 210.6 lb

## 2021-06-19 DIAGNOSIS — Z3483 Encounter for supervision of other normal pregnancy, third trimester: Secondary | ICD-10-CM

## 2021-06-19 DIAGNOSIS — O99013 Anemia complicating pregnancy, third trimester: Secondary | ICD-10-CM

## 2021-06-19 DIAGNOSIS — O99019 Anemia complicating pregnancy, unspecified trimester: Secondary | ICD-10-CM

## 2021-06-19 DIAGNOSIS — B379 Candidiasis, unspecified: Secondary | ICD-10-CM

## 2021-06-19 DIAGNOSIS — Z3480 Encounter for supervision of other normal pregnancy, unspecified trimester: Secondary | ICD-10-CM

## 2021-06-19 LAB — WET PREP FOR TRICH, YEAST, CLUE: Trichomonas Exam: NEGATIVE

## 2021-06-19 MED ORDER — CLOTRIMAZOLE 1 % VA CREA: 1.0000 | TOPICAL_CREAM | Freq: Every day | VAGINAL | 0 refills | Status: DC

## 2021-06-19 NOTE — Progress Notes (Signed)
Patient here for MH RV at 40 2/7. Aware of IOL date and time, and covid testing date and time. Patient given paperwork to take to covid test appointment.Burt Knack, RN

## 2021-06-19 NOTE — Addendum Note (Signed)
Addended by: Burt Knack on: 06/19/2021 04:57 PM   Modules accepted: Orders

## 2021-06-19 NOTE — Progress Notes (Signed)
   PRENATAL VISIT NOTE  Subjective:  Nicole Rocha is a 20 y.o. G2P0010 at [redacted]w[redacted]d being seen today for ongoing prenatal care.  She is currently monitored for the following issues for this low-risk pregnancy and has Supervision of other normal pregnancy, antepartum; Abnormal Pap smear of cervix 11/26/20 LSIL; Anemia affecting pregnancy; and Abnormal glucose affecting pregnancy - Equivocal 3 hGTT on their problem list.  Patient reports  not sure if SROM x2 days .  Contractions: Not present. Vag. Bleeding: None.  Movement: Present.  Denies leaking of fluid/ROM.   The following portions of the patient's history were reviewed and updated as appropriate: allergies, current medications, past family history, past medical history, past social history, past surgical history and problem list. Problem list updated.  Objective:   Vitals:   06/19/21 1450  BP: 118/66  Pulse: 76  Temp: (!) 97.5 F (36.4 C)  Weight: 210 lb 9.6 oz (95.5 kg)    Fetal Status: Fetal Heart Rate (bpm): 130 Fundal Height: 40 cm Movement: Present  Presentation: Vertex  General:  Alert, oriented and cooperative. Patient is in no acute distress.  Skin: Skin is warm and dry. No rash noted.   Cardiovascular: Normal heart rate noted  Respiratory: Normal respiratory effort, no problems with respiration noted  Abdomen: Soft, gravid, appropriate for gestational age.  Pain/Pressure: Absent     Pelvic: Cervical exam performed Dilation: 1 Effacement (%): 100 Station: -2  Extremities: Normal range of motion.  Edema: None  Mental Status: Normal mood and affect. Normal behavior. Normal judgment and thought content.   Assessment and Plan:  Pregnancy: G2P0010 at [redacted]w[redacted]d  1. Supervision of other normal pregnancy, antepartum Knows when to go to L&D IOL 06/27/21 with covid testing 06/25/21 ?SROM x 2 days--SVE with no pooling, thick white adherrent leukorrhea, ph<4.5, no ferning, +yeast Membranes stripped - WET PREP FOR TRICH, YEAST,  CLUE  2. Anemia affecting pregnancy, antepartum Not taking FeSo4   Term labor symptoms and general obstetric precautions including but not limited to vaginal bleeding, contractions, leaking of fluid and fetal movement were reviewed in detail with the patient. Please refer to After Visit Summary for other counseling recommendations.  No follow-ups on file.  No future appointments.  Alberteen Spindle, CNM

## 2021-06-19 NOTE — Progress Notes (Signed)
Wet prep reviewed, patient treated for yeast per SO.Jay Kempe Brewer-Jensen, RN  

## 2021-06-22 ENCOUNTER — Inpatient Hospital Stay
Admission: EM | Admit: 2021-06-22 | Discharge: 2021-06-25 | DRG: 805 | Disposition: A | Discharge status: Home / Self Care | Payer: Medicaid Other | Attending: Obstetrics | Admitting: Obstetrics

## 2021-06-22 DIAGNOSIS — Z20822 Contact with and (suspected) exposure to covid-19: Secondary | ICD-10-CM | POA: Diagnosis present

## 2021-06-22 DIAGNOSIS — O99810 Abnormal glucose complicating pregnancy: Secondary | ICD-10-CM

## 2021-06-22 DIAGNOSIS — O26893 Other specified pregnancy related conditions, third trimester: Secondary | ICD-10-CM | POA: Diagnosis present

## 2021-06-22 DIAGNOSIS — D62 Acute posthemorrhagic anemia: Secondary | ICD-10-CM | POA: Diagnosis not present

## 2021-06-22 DIAGNOSIS — O411230 Chorioamnionitis, third trimester, not applicable or unspecified: Secondary | ICD-10-CM | POA: Diagnosis present

## 2021-06-22 DIAGNOSIS — O9081 Anemia of the puerperium: Secondary | ICD-10-CM | POA: Diagnosis not present

## 2021-06-22 DIAGNOSIS — O4292 Full-term premature rupture of membranes, unspecified as to length of time between rupture and onset of labor: Principal | ICD-10-CM | POA: Diagnosis present

## 2021-06-22 DIAGNOSIS — O99013 Anemia complicating pregnancy, third trimester: Secondary | ICD-10-CM

## 2021-06-22 DIAGNOSIS — Z3480 Encounter for supervision of other normal pregnancy, unspecified trimester: Secondary | ICD-10-CM

## 2021-06-22 DIAGNOSIS — Z3490 Encounter for supervision of normal pregnancy, unspecified, unspecified trimester: Secondary | ICD-10-CM | POA: Insufficient documentation

## 2021-06-22 DIAGNOSIS — Z3A40 40 weeks gestation of pregnancy: Secondary | ICD-10-CM

## 2021-06-22 DIAGNOSIS — O99019 Anemia complicating pregnancy, unspecified trimester: Secondary | ICD-10-CM | POA: Diagnosis present

## 2021-06-22 LAB — RESP PANEL BY RT-PCR (FLU A&B, COVID) ARPGX2
Influenza A by PCR: NEGATIVE
Influenza B by PCR: NEGATIVE
SARS Coronavirus 2 by RT PCR: NEGATIVE

## 2021-06-22 LAB — GLUCOSE, CAPILLARY: Glucose-Capillary: 71 mg/dL (ref 70–99)

## 2021-06-22 MED ORDER — ONDANSETRON HCL 4 MG/2ML IJ SOLN: 4.0000 mg | Freq: Four times a day (QID) | INTRAMUSCULAR | Status: DC | PRN

## 2021-06-22 MED ORDER — ACETAMINOPHEN 325 MG PO TABS
650.0000 mg | ORAL_TABLET | ORAL | Status: DC | PRN
  Administered 2021-06-23: 650 mg via ORAL
  Filled 2021-06-22: qty 2

## 2021-06-22 MED ORDER — OXYTOCIN BOLUS FROM INFUSION
333.0000 mL | Freq: Once | INTRAVENOUS | Status: AC
  Administered 2021-06-23: 333 mL via INTRAVENOUS

## 2021-06-22 MED ORDER — OXYTOCIN-SODIUM CHLORIDE 30-0.9 UT/500ML-% IV SOLN
1.0000 m[IU]/min | INTRAVENOUS | Status: DC
Start: 2021-06-22 — End: 2021-06-24
  Administered 2021-06-23: 2 m[IU]/min via INTRAVENOUS
  Filled 2021-06-22: qty 1000

## 2021-06-22 MED ORDER — LIDOCAINE HCL (PF) 1 % IJ SOLN: 30.0000 mL | INTRAMUSCULAR | Status: DC | PRN

## 2021-06-22 MED ORDER — BUTORPHANOL TARTRATE 1 MG/ML IJ SOLN
1.0000 mg | INTRAMUSCULAR | Status: DC | PRN
  Administered 2021-06-23 (×2): 1 mg via INTRAVENOUS
  Filled 2021-06-22 (×2): qty 1

## 2021-06-22 MED ORDER — MISOPROSTOL 25 MCG QUARTER TABLET: 25.0000 ug | ORAL_TABLET | ORAL | Status: DC | PRN

## 2021-06-22 MED ORDER — OXYTOCIN-SODIUM CHLORIDE 30-0.9 UT/500ML-% IV SOLN
2.5000 [IU]/h | INTRAVENOUS | Status: DC
  Administered 2021-06-23: 2.5 [IU]/h via INTRAVENOUS

## 2021-06-22 MED ORDER — LACTATED RINGERS IV SOLN: INTRAVENOUS | Status: DC

## 2021-06-22 MED ORDER — TERBUTALINE SULFATE 1 MG/ML IJ SOLN: 0.2500 mg | Freq: Once | INTRAMUSCULAR | Status: DC | PRN

## 2021-06-22 MED ORDER — LACTATED RINGERS IV SOLN
500.0000 mL | INTRAVENOUS | Status: DC | PRN
  Administered 2021-06-23 (×2): 500 mL via INTRAVENOUS

## 2021-06-22 NOTE — OB Triage Note (Signed)
Pt Nicole Rocha 20 y.o. presents to labor and delivery triage reporting loss of fluid. Pt is a G3P0020 at [redacted]w[redacted]d . Pt shows signs and symptoms consistent with rupture of membranes or active vaginal bleeding. Pt denies contractions and states positive fetal movement. External FM and TOCO applied to non-tender abdomen and assessing. Initial FHR 140. Vital signs obtained and within normal limits. Provider notified of pt.

## 2021-06-22 NOTE — Progress Notes (Signed)
Nicole Rocha is a 20 y.o. G2P0010 at [redacted]w[redacted]d by LMP admitted for rupture of membranes. She has been waiting for her COVID test results, and is now in a labor room. She reports (via a Nurse, learning disability) that her contractions feel stronger. She also continues to have some leaking of fluid.  Subjective:  She is more comfortable standing through her contractions.   Objective: BP 124/75 (BP Location: Right Arm)   Pulse 76   Temp 98.9 F (37.2 C) (Oral)   Resp 16   LMP 09/04/2020  No intake/output data recorded. No intake/output data recorded.  FHT:  FHR: 140 bpm, variability: moderate,  accelerations:  Present,  decelerations:  Absent UC:   regular, every 2-3 minutes SVE:   Dilation: 1.5 Effacement (%): 100 Station: -1 Exam by:: Paula Compton CNM  Labs: Lab Results  Component Value Date   WBC 9.1 03/26/2021   HGB 10.9 (L) 03/26/2021   HCT 32.6 (L) 03/26/2021   MCV 87 03/26/2021   PLT 294 03/26/2021  Random glucose:71  Assessment / Plan: Spontaneous labor, progressing normally. Will allow for natural labor progression post SROM, and consider augmentation with pitocin after 8 hours.  Labor: Progressing normally  Fetal Wellbeing:  Category I Pain Control:  Labor support without medications I/D:  n/a Anticipated MOD:  NSVD    Mirna Mires 06/22/2021, 11:42 PM

## 2021-06-22 NOTE — H&P (Signed)
Nicole Rocha is a 20 y.o. female presenting with grow rupture of membranes at 40 weeks 5 days. She reports feeling a large gush of fluid at approximately 1830. The fluid was clear, and she continues leaking. Her baby has been moving well, and she is also contracting irregularly. An interpretor is being used as the patient does not speak [place]. OB History     Gravida  2   Para  0   Term  0   Preterm  0   AB  1   Living  0      SAB  1   IAB  0   Ectopic  0   Multiple  0   Live Births  0          Past Medical History:  Diagnosis Date   Abnormal Pap smear of cervix 11/26/20 LSIL 03/11/2021   Past Surgical History:  Procedure Laterality Date   NO PAST SURGERIES     Family History: family history includes Healthy in her brother, brother, father, and mother. Social History:  reports that she has never smoked. She has never been exposed to tobacco smoke. She has never used smokeless tobacco. She reports that she does not currently use alcohol. She reports that she does not currently use drugs.     Maternal Diabetes: No Genetic Screening: Declined Maternal Ultrasounds/Referrals: Normal Fetal Ultrasounds or other Referrals:  Referred to Materal Fetal Medicine  Maternal Substance Abuse:  No Significant Maternal Medications:  None Significant Maternal Lab Results:  Group B Strep negative and Other:  Her 1hr GTT was elevated (176), and her 3 hour GTT had missing values. Other Comments:  None  Review of Systems  Constitutional: Negative.   HENT: Negative.    Eyes: Negative.   Respiratory: Negative.    Cardiovascular: Negative.   Gastrointestinal: Negative.        Gravid abdomen  Endocrine: Negative.   Genitourinary: Negative.   Musculoskeletal: Negative.   Allergic/Immunologic: Negative.   Neurological: Negative.   Hematological: Negative.   Psychiatric/Behavioral: Negative.    History Dilation: 1.5 Effacement (%): 100 Station: -1 Exam by:: Paula Compton CNM Blood pressure 124/75, pulse 76, temperature 98.9 F (37.2 C), temperature source Oral, resp. rate 16, last menstrual period 09/04/2020. Maternal Exam:  Uterine Assessment: Contraction strength is moderate.  Contraction frequency is regular.  Abdomen: Estimated fetal weight is 8 lbs.   Fetal presentation: vertex Introitus: Normal vulva. Normal vagina.  Amniotic fluid character: clear. Pelvis: adequate for delivery.   Cervix: Cervix evaluated by digital exam.    Physical Exam Genitourinary:    General: Normal vulva.    Prenatal labs: ABO, Rh: A/Positive/-- (02/16 1550) Antibody: Negative (02/16 1550) Rubella: 7.47 (02/16 1550) RPR: Non Reactive (06/16 1432)  HBsAg: Negative (02/16 1550)  HIV:    GBS: Negative/-- (08/12 0454)   Assessment/Plan: IUP 40 weeks 5 days SROM- clear fluid Reactive FHTS Category 1 FHTs Early labor Plan: Admit for labor Routine labs EFM continuous Will augment with pitocin as needed. Pain control options described to her. Recheck in several hours or PRN.  Nicole Rocha, CNM  06/22/2021 9:38 PM     Nicole Rocha Nicole Rocha 06/22/2021, 9:23 PM

## 2021-06-23 ENCOUNTER — Encounter: Payer: Self-pay | Admitting: Advanced Practice Midwife

## 2021-06-23 ENCOUNTER — Inpatient Hospital Stay: Payer: Medicaid Other | Admitting: Registered Nurse

## 2021-06-23 ENCOUNTER — Other Ambulatory Visit: Payer: Self-pay

## 2021-06-23 DIAGNOSIS — Z3A40 40 weeks gestation of pregnancy: Secondary | ICD-10-CM

## 2021-06-23 DIAGNOSIS — O480 Post-term pregnancy: Secondary | ICD-10-CM

## 2021-06-23 LAB — CBC
HCT: 34.6 % — ABNORMAL LOW (ref 36.0–46.0)
Hemoglobin: 11.8 g/dL — ABNORMAL LOW (ref 12.0–15.0)
MCH: 27.4 pg (ref 26.0–34.0)
MCHC: 34.1 g/dL (ref 30.0–36.0)
MCV: 80.3 fL (ref 80.0–100.0)
Platelets: 294 10*3/uL (ref 150–400)
RBC: 4.31 MIL/uL (ref 3.87–5.11)
RDW: 14.8 % (ref 11.5–15.5)
WBC: 8.6 10*3/uL (ref 4.0–10.5)
nRBC: 0 % (ref 0.0–0.2)

## 2021-06-23 LAB — GLUCOSE, RANDOM: Glucose, Bld: 80 mg/dL (ref 70–99)

## 2021-06-23 LAB — TYPE AND SCREEN
ABO/RH(D): A POS
Antibody Screen: NEGATIVE

## 2021-06-23 LAB — RPR: RPR Ser Ql: NONREACTIVE

## 2021-06-23 MED ORDER — CALCIUM CARBONATE ANTACID 500 MG PO CHEW
2.0000 | CHEWABLE_TABLET | Freq: Every day | ORAL | Status: DC
  Administered 2021-06-23: 400 mg via ORAL

## 2021-06-23 MED ORDER — LIDOCAINE-EPINEPHRINE (PF) 1.5 %-1:200000 IJ SOLN: INTRAMUSCULAR | Status: DC | PRN

## 2021-06-23 MED ORDER — FENTANYL-BUPIVACAINE-NACL 0.5-0.125-0.9 MG/250ML-% EP SOLN
EPIDURAL | Status: DC | PRN
  Administered 2021-06-23: 12 mL/h via EPIDURAL

## 2021-06-23 MED ORDER — AMMONIA AROMATIC IN INHA
RESPIRATORY_TRACT | Status: AC
  Filled 2021-06-23: qty 10

## 2021-06-23 MED ORDER — MISOPROSTOL 200 MCG PO TABS
ORAL_TABLET | ORAL | Status: AC
  Filled 2021-06-23: qty 4

## 2021-06-23 MED ORDER — SODIUM CHLORIDE 0.9 % IV SOLN
2.0000 g | Freq: Four times a day (QID) | INTRAVENOUS | Status: DC
  Administered 2021-06-23 – 2021-06-25 (×6): 2 g via INTRAVENOUS
  Filled 2021-06-23: qty 2000
  Filled 2021-06-23: qty 2
  Filled 2021-06-23: qty 2000
  Filled 2021-06-23: qty 2
  Filled 2021-06-23 (×2): qty 2000
  Filled 2021-06-23: qty 2

## 2021-06-23 MED ORDER — GENTAMICIN SULFATE 40 MG/ML IJ SOLN: 5.0000 mg/kg | INTRAVENOUS | Status: DC

## 2021-06-23 MED ORDER — LIDOCAINE HCL (PF) 1 % IJ SOLN
INTRAMUSCULAR | Status: AC
  Filled 2021-06-23: qty 30

## 2021-06-23 MED ORDER — LIDOCAINE-EPINEPHRINE (PF) 1.5 %-1:200000 IJ SOLN
INTRAMUSCULAR | Status: DC | PRN
  Administered 2021-06-23: 3 mL via EPIDURAL

## 2021-06-23 MED ORDER — OXYTOCIN 10 UNIT/ML IJ SOLN
INTRAMUSCULAR | Status: AC
  Filled 2021-06-23: qty 2

## 2021-06-23 MED ORDER — MISOPROSTOL 200 MCG PO TABS
800.0000 ug | ORAL_TABLET | Freq: Once | ORAL | Status: AC
  Administered 2021-06-23: 800 ug via RECTAL

## 2021-06-23 MED ORDER — CARBOPROST TROMETHAMINE 250 MCG/ML IM SOLN
INTRAMUSCULAR | Status: AC
  Filled 2021-06-23: qty 1

## 2021-06-23 MED ORDER — CALCIUM CARBONATE ANTACID 500 MG PO CHEW
CHEWABLE_TABLET | ORAL | Status: AC
  Filled 2021-06-23: qty 2

## 2021-06-23 MED ORDER — GENTAMICIN SULFATE 40 MG/ML IJ SOLN
5.0000 mg/kg | INTRAVENOUS | Status: DC
  Administered 2021-06-23 – 2021-06-24 (×2): 380 mg via INTRAVENOUS
  Filled 2021-06-23 (×3): qty 9.5

## 2021-06-23 MED ORDER — SODIUM CHLORIDE 0.9 % IV SOLN
INTRAVENOUS | Status: AC
  Filled 2021-06-23: qty 2000

## 2021-06-23 MED ORDER — MISOPROSTOL 200 MCG PO TABS
ORAL_TABLET | ORAL | Status: AC
  Filled 2021-06-23: qty 1

## 2021-06-23 MED ORDER — FENTANYL-BUPIVACAINE-NACL 0.5-0.125-0.9 MG/250ML-% EP SOLN
EPIDURAL | Status: AC
  Filled 2021-06-23: qty 250

## 2021-06-23 MED ORDER — BUPIVACAINE HCL (PF) 0.25 % IJ SOLN
INTRAMUSCULAR | Status: DC | PRN
  Administered 2021-06-23: 3 mL via EPIDURAL
  Administered 2021-06-23: 2 mL via EPIDURAL
  Administered 2021-06-23: 4 mL via EPIDURAL
  Administered 2021-06-23: 2 mL via EPIDURAL

## 2021-06-23 MED ORDER — METHYLERGONOVINE MALEATE 0.2 MG/ML IJ SOLN
INTRAMUSCULAR | Status: AC
  Filled 2021-06-23: qty 1

## 2021-06-23 MED ORDER — METHYLERGONOVINE MALEATE 0.2 MG/ML IJ SOLN
0.2000 mg | Freq: Once | INTRAMUSCULAR | Status: AC
  Administered 2021-06-23: 0.2 mg via INTRAMUSCULAR

## 2021-06-23 MED ORDER — LIDOCAINE HCL (PF) 1 % IJ SOLN
INTRAMUSCULAR | Status: DC | PRN
  Administered 2021-06-23 (×2): 2 mL via SUBCUTANEOUS
  Administered 2021-06-23: 3 mL via SUBCUTANEOUS

## 2021-06-23 NOTE — Anesthesia Procedure Notes (Addendum)
Epidural  Start time: 06/23/2021 7:27 AM End time: 06/23/2021 7:33 AM  Staffing Resident/CRNA: Lynden Oxford, CRNA Performed: resident/CRNA   Preanesthetic Checklist Completed: patient identified, IV checked, site marked, risks and benefits discussed, monitors and equipment checked, pre-op evaluation and timeout performed  Epidural Patient position: sitting Prep: DuraPrep Patient monitoring: blood pressure and continuous pulse ox Approach: midline Location: L3-L4 Injection technique: LOR air  Needle:  Needle type: Tuohy  Needle gauge: 18 G Needle length: 9 cm Needle insertion depth: 8 cm Catheter type: closed end flexible Catheter size: 19 Gauge Catheter at skin depth: 15 cm Test dose: negative and Other (0.25% bupivicaine)  Additional Notes Reason for block:procedure for pain

## 2021-06-23 NOTE — Progress Notes (Signed)
9:24 am Patient comfortable with epidural. Spoke with her using interpreter services.  SVE 2/90/-2, stretches to 5/90/-2 IUPC placed.  Continue with pitocin augmentation. NST: 145 bpm baseline, moderate variability, 15x15 accelerations, no decelerations. Tocometer : every 2-3 mintues  Adelene Idler MD, [facility], [facility] 06/23/2021 10:12 AM

## 2021-06-23 NOTE — Progress Notes (Signed)
Nicole Rocha is a 20 y.o. G2P0010 at [redacted]w[redacted]d by LMP admitted for rupture of membranes. She was started on pitocin infusion  at 0430.  Subjective:  she is very uncomfortable; has had tw3o doses of Stadol but is expressing high pain level; she is tearful. Has declined epidural until now.   Objective: BP 123/86 (BP Location: Left Arm)   Pulse 85   Temp 98.3 F (36.8 C) (Oral)   Resp 18   Ht 5\' 8"  (1.727 m)   Wt 95.3 kg   LMP 09/04/2020   BMI 31.93 kg/m  No intake/output data recorded. No intake/output data recorded.  FHT:  FHR: 140 bpm, variability: moderate,  accelerations:  Present,  decelerations:  Absent UC:   regular, every 2-2.5 minutes SVE:   Dilation: 2 Effacement (%): 100 Station: 0 Exam by:: [email or web address], CNM Pitocin at 6 mu/min  Labs: Lab Results  Component Value Date   WBC 8.6 06/22/2021   HGB 11.8 (L) 06/22/2021   HCT 34.6 (L) 06/22/2021   MCV 80.3 06/22/2021   PLT 294 06/22/2021    Assessment / Plan: Augmentation of labor with pitocin. Little progress made. SROM since 1830 last evening   Fetal Wellbeing:  Category I Pain Control:  IV pain meds- now discussing and consenting to epidural I/D:  n/a Anticipated MOD:  NSVD Will place IUPC once comfortable with epidural. Translator used to explain epidural placement.  08/22/2021 06/23/2021, 7:36 AM

## 2021-06-23 NOTE — Progress Notes (Signed)
Patient has been making very slow progress throughout the day.  Pitocin augmentation has not resulted is sufficiently adequate contractions. Patient was started on antibiotics at 5 PM for fetal tachycardia and fever.    SVE 7.5/100/+1 NST: 150 bpm baseline, moderate variability, accelerations present, no decelerations. Tocometer : every 2- 3 minutes  I discussed with the patient her slow progress and offered a cesarean section. She would prefer to recheck in 2 hours.   Continue IV antibiotics.    Adelene Idler MD, [facility], [facility] 06/23/2021 8:36 PM

## 2021-06-23 NOTE — Anesthesia Preprocedure Evaluation (Signed)
Anesthesia Evaluation  Patient identified by MRN, date of birth, ID band Patient awake    Reviewed: Allergy & Precautions, H&P , NPO status , Patient's Chart, lab work & pertinent test results  History of Anesthesia Complications Negative for: history of anesthetic complications  Airway Mallampati: II       Dental   Pulmonary neg pulmonary ROS,    Pulmonary exam normal        Cardiovascular (-) hypertensionnegative cardio ROS Normal cardiovascular exam     Neuro/Psych negative neurological ROS     GI/Hepatic negative GI ROS, Neg liver ROS,   Endo/Other  negative endocrine ROS  Renal/GU negative Renal ROS  negative genitourinary   Musculoskeletal   Abdominal   Peds  Hematology  (+) Blood dyscrasia, anemia ,   Anesthesia Other Findings   Reproductive/Obstetrics (+) Pregnancy                             Anesthesia Physical Anesthesia Plan  ASA: 2  Anesthesia Plan: Epidural   Post-op Pain Management:    Induction:   PONV Risk Score and Plan:   Airway Management Planned:   Additional Equipment:   Intra-op Plan:   Post-operative Plan:   Informed Consent:     Interpreter used for interview  Plan Discussed with: CRNA and Anesthesiologist  Anesthesia Plan Comments:         Anesthesia Quick Evaluation

## 2021-06-23 NOTE — Discharge Summary (Addendum)
Postpartum Discharge Summary    Patient Name: Nicole Rocha DOB: May 17, 2001 MRN: [id number]  Date of admission: 06/22/2021 Delivery date:06/23/2021  Delivering provider: Adrian Prows R  Date of discharge: 06/25/2021  Admitting diagnosis: Encounter for planned induction of labor [Z34.90] Intrauterine pregnancy: [redacted]w[redacted]d     Secondary diagnosis:  Principal Problem:   Supervision of other normal pregnancy, antepartum Active Problems:   Anemia affecting pregnancy   [redacted] weeks gestation of pregnancy  Additional problems: none    Discharge diagnosis: Term Pregnancy Delivered                                              Post partum procedures: none Augmentation: Pitocin Complications: Intrauterine Inflammation or infection (Chorioamniotis) and ROM>24 hours  Hospital course: Induction of Labor With Vaginal Delivery   20 y.o. yo G2P1011 at [redacted]w[redacted]d was admitted to the hospital 06/22/2021 for induction of labor.  Indication for induction: PROM.  Patient had an uncomplicated labor course as follows: Membrane Rupture Time/Date: 6:30 PM ,06/22/2021   Delivery Method:Vaginal, Spontaneous  Episiotomy: None  Lacerations:  2nd degree  Details of delivery can be found in separate delivery note.  Patient had a routine postpartum course. Patient is discharged home 06/25/21.  Newborn Data: Birth date:06/23/2021  Birth time:10:59 PM  Gender:Female  Living status:Living  Apgars:7 ,9  Weight:4300 g   Magnesium Sulfate received: No BMZ received: No Rhophylac:No MMR:No T-DaP:Given prenatally Flu: No Transfusion:No  Physical exam  Vitals:   06/24/21 1647 06/24/21 2015 06/25/21 0200 06/25/21 0842  BP: 100/66 108/72 103/74 103/73  Pulse: 97 79 82 71  Resp: $Remo'18 18 18 20  'Jrseo$ Temp: 97.7 F (36.5 C) 98 F (36.7 C) 98.2 F (36.8 C) 97.7 F (36.5 C)  TempSrc: Oral Oral Oral Oral  SpO2:    98%  Weight:      Height:       General: alert, cooperative, and no distress Lochia:  appropriate Uterine Fundus: firm Incision: N/A DVT Evaluation: No evidence of DVT seen on physical exam. No cords or calf tenderness. No significant calf/ankle edema. Labs: Lab Results  Component Value Date   WBC 15.7 (H) 06/24/2021   HGB 11.5 (L) 06/24/2021   HCT 34.1 (L) 06/24/2021   MCV 78.9 (L) 06/24/2021   PLT 242 06/24/2021   CMP Latest Ref Rng & Units 06/22/2021  Glucose 70 - 99 mg/dL 80  BUN 6 - 20 mg/dL -  Creatinine 0.44 - 1.00 mg/dL -  Sodium 135 - 145 mmol/L -  Potassium 3.5 - 5.1 mmol/L -  Chloride 98 - 111 mmol/L -  CO2 22 - 32 mmol/L -  Calcium 8.9 - 10.3 mg/dL -  Total Protein 6.5 - 8.1 g/dL -  Total Bilirubin 0.3 - 1.2 mg/dL -  Alkaline Phos 38 - 126 U/L -  AST 15 - 41 U/L -  ALT 0 - 44 U/L -   Edinburgh Score: Edinburgh Postnatal Depression Scale Screening Tool 06/24/2021  I have been able to laugh and see the funny side of things. 0  I have looked forward with enjoyment to things. 0  I have blamed myself unnecessarily when things went wrong. 0  I have been anxious or worried for no good reason. 0  I have felt scared or panicky for no good reason. 0  Things have been getting on top of me. 0  I have been so unhappy that I have had difficulty sleeping. 0  I have felt sad or miserable. 0  I have been so unhappy that I have been crying. 0  The thought of harming myself has occurred to me. 0  Edinburgh Postnatal Depression Scale Total 0      After visit meds:  Allergies as of 06/25/2021   No Known Allergies      Medication List     STOP taking these medications    clotrimazole 1 % vaginal cream Commonly known as: GYNE-LOTRIMIN       TAKE these medications    acetaminophen 500 MG tablet Commonly known as: TYLENOL Take 2 tablets (1,000 mg total) by mouth every 6 (six) hours as needed for mild pain.   ibuprofen 600 MG tablet Commonly known as: ADVIL Take 1 tablet (600 mg total) by mouth every 6 (six) hours as needed for mild pain,  cramping or moderate pain.   Prenatal Vitamin 27-0.8 MG Tabs Take 1 tablet by mouth daily at 6 (six) AM.               Discharge Care Instructions  (From admission, onward)           Start     Ordered   06/25/21 0000  Discharge wound care:       Comments: Perform wound care instructions   06/25/21 1106             Discharge home in stable condition Infant Feeding: Breast Infant Disposition:home with mother Discharge instruction: per After Visit Summary and Postpartum booklet. Activity: Advance as tolerated. Pelvic rest for 6 weeks.  Diet: routine diet Anticipated Birth Control: IUD Postpartum Appointment:2 weeks Future Appointments:No future appointments. Follow up Visit:  Follow-up Information     Department, [facility] Follow up in 2 week(s).   Why: postpartum visit Contact information: [place] [phone]                SIGNED:  Prentice Docker, MD, [place], [facility] 06/25/2021 11:10 AM

## 2021-06-23 NOTE — Anesthesia Procedure Notes (Addendum)
Epidural  Start time: 06/23/2021 8:35 AM End time: 06/23/2021 8:50 AM  Staffing Anesthesiologist: Alver Fisher, MD Resident/CRNA: Elmarie Mainland, CRNA Performed: resident/CRNA   Preanesthetic Checklist Completed: patient identified, IV checked, site marked, risks and benefits discussed, monitors and equipment checked, pre-op evaluation and timeout performed  Epidural Patient position: sitting Prep: ChloraPrep Patient monitoring: continuous pulse ox and blood pressure Approach: midline Location: L3-L4 Injection technique: LOR air  Needle:  Needle type: Tuohy  Needle gauge: 17 G Needle length: 9 cm Needle insertion depth: 7 cm Catheter type: closed end flexible Catheter size: 19 Gauge Catheter at skin depth: 11 cm Test dose: 1.5% lidocaine with Epi 1:200 K  Additional Notes Reason for block:procedure for pain

## 2021-06-24 LAB — CBC
HCT: 34.1 % — ABNORMAL LOW (ref 36.0–46.0)
Hemoglobin: 11.5 g/dL — ABNORMAL LOW (ref 12.0–15.0)
MCH: 26.6 pg (ref 26.0–34.0)
MCHC: 33.7 g/dL (ref 30.0–36.0)
MCV: 78.9 fL — ABNORMAL LOW (ref 80.0–100.0)
Platelets: 242 10*3/uL (ref 150–400)
RBC: 4.32 MIL/uL (ref 3.87–5.11)
RDW: 15.1 % (ref 11.5–15.5)
WBC: 15.7 10*3/uL — ABNORMAL HIGH (ref 4.0–10.5)
nRBC: 0 % (ref 0.0–0.2)

## 2021-06-24 MED ORDER — SIMETHICONE 80 MG PO CHEW: 80.0000 mg | CHEWABLE_TABLET | ORAL | Status: DC | PRN

## 2021-06-24 MED ORDER — ONDANSETRON HCL 4 MG PO TABS: 4.0000 mg | ORAL_TABLET | ORAL | Status: DC | PRN

## 2021-06-24 MED ORDER — DIBUCAINE (PERIANAL) 1 % EX OINT
1.0000 "application " | TOPICAL_OINTMENT | CUTANEOUS | Status: DC | PRN
  Filled 2021-06-24: qty 28

## 2021-06-24 MED ORDER — DOCUSATE SODIUM 100 MG PO CAPS
100.0000 mg | ORAL_CAPSULE | Freq: Two times a day (BID) | ORAL | Status: DC
  Administered 2021-06-24 – 2021-06-25 (×3): 100 mg via ORAL
  Filled 2021-06-24 (×4): qty 1

## 2021-06-24 MED ORDER — IBUPROFEN 600 MG PO TABS
600.0000 mg | ORAL_TABLET | Freq: Four times a day (QID) | ORAL | Status: DC
  Administered 2021-06-24 – 2021-06-25 (×5): 600 mg via ORAL
  Filled 2021-06-24 (×5): qty 1

## 2021-06-24 MED ORDER — COCONUT OIL OIL: 1.0000 "application " | TOPICAL_OIL | Status: DC | PRN

## 2021-06-24 MED ORDER — ONDANSETRON HCL 4 MG/2ML IJ SOLN
4.0000 mg | INTRAMUSCULAR | Status: DC | PRN
  Administered 2021-06-24: 4 mg via INTRAVENOUS
  Filled 2021-06-24: qty 2

## 2021-06-24 MED ORDER — WITCH HAZEL-GLYCERIN EX PADS
1.0000 "application " | MEDICATED_PAD | CUTANEOUS | Status: DC | PRN
  Filled 2021-06-24: qty 100

## 2021-06-24 MED ORDER — ACETAMINOPHEN 500 MG PO TABS
1000.0000 mg | ORAL_TABLET | Freq: Four times a day (QID) | ORAL | Status: DC
  Administered 2021-06-24 – 2021-06-25 (×5): 1000 mg via ORAL
  Filled 2021-06-24 (×5): qty 2

## 2021-06-24 MED ORDER — ZOLPIDEM TARTRATE 5 MG PO TABS: 5.0000 mg | ORAL_TABLET | Freq: Every evening | ORAL | Status: DC | PRN

## 2021-06-24 MED ORDER — BENZOCAINE-MENTHOL 20-0.5 % EX AERO
1.0000 "application " | INHALATION_SPRAY | CUTANEOUS | Status: DC | PRN
  Administered 2021-06-24: 1 via TOPICAL
  Filled 2021-06-24: qty 56

## 2021-06-24 MED ORDER — DIPHENHYDRAMINE HCL 25 MG PO CAPS: 25.0000 mg | ORAL_CAPSULE | Freq: Four times a day (QID) | ORAL | Status: DC | PRN

## 2021-06-24 MED ORDER — PRENATAL MULTIVITAMIN CH
1.0000 | ORAL_TABLET | Freq: Every day | ORAL | Status: DC
  Administered 2021-06-24: 1 via ORAL
  Filled 2021-06-24: qty 1

## 2021-06-24 NOTE — Lactation Note (Signed)
This note was copied from a baby's chart. Lactation Consultation Note  Patient Name: Nicole Rocha Date: 06/24/2021 Reason for consult: Initial assessment;Term;Other (Comment) (LGA) Age:22 hours  Lactation visit. Interpreter [phone] Mom is P1, SVD 17 hours ago. Baby has several feedings, some formula supplement, and no documented output at this time. RN attempted hand expression, notes no output. Mom has some nipple damage on R nipple that appears to be related to shallow latch, possible position concerns.   LC, with use of interpreter services explained position, latch, difference in shallow versus deep. Tips given for nose to nipple, rubbing nipple to chin, pulling baby in close, and sandwiching of her tissue. Tips also given for keeping baby awake and alert at the breast to ensure active feeding. LC offers to assist with feeding attempt since baby is still giving hunger cues. Mom is tired at this time. Game plan for today:  -provide supplement of 72mL at this time -next feeding put baby to breast; offering left side, practicing the tips/strategies that given. Mom and dad both verbalize understanding.    Maternal Data Has patient been taught Hand Expression?: Yes Does the patient have breastfeeding experience prior to this delivery?: No  Feeding Mother's Current Feeding Choice: Breast Milk and Formula Nipple Type: Slow - flow  LATCH Score          Comfort (Breast/Nipple): Filling, red/small blisters or bruises, mild/mod discomfort         Lactation Tools Discussed/Used Tools: Coconut oil  Interventions Interventions: Breast feeding basics reviewed;Hand express;Position options;Education  Discharge    Consult Status Consult Status: Follow-up Date: 06/24/21 Follow-up type: Call as needed    Danford Bad 06/24/2021, 4:26 PM

## 2021-06-24 NOTE — Progress Notes (Signed)
Subjective:  She is doing well on postpartum day 1; tolerating regular diet, pain controlled on PO medication, ambulating and voiding without difficulty. She reports breastfeeding is going well.   Objective:  Vital signs in last 24 hours: Temp:  [97.8 F (36.6 C)-100.9 F (38.3 C)] 98.4 F (36.9 C) (09/14 1140) Pulse Rate:  [70-135] 70 (09/14 1140) Resp:  [18-20] 18 (09/14 1140) BP: (102-141)/(42-93) 121/78 (09/14 1140) SpO2:  [94 %-100 %] 98 % (09/14 0841)    General: NAD Pulmonary: no increased work of breathing Abdomen: non-distended, non-tender, fundus firm at level of umbilicus Extremities: no edema, no erythema, no tenderness  Results for orders placed or performed during the hospital encounter of 06/22/21 (from the past 72 hour(s))  Resp Panel by RT-PCR (Flu A&B, Covid) Nasopharyngeal Swab     Status: None   Collection Time: 06/22/21  9:43 PM   Specimen: Nasopharyngeal Swab; Nasopharyngeal(NP) swabs in vial transport medium  Result Value Ref Range   SARS Coronavirus 2 by RT PCR NEGATIVE NEGATIVE    Comment: (NOTE) SARS-CoV-2 target nucleic acids are NOT DETECTED.  The SARS-CoV-2 RNA is generally detectable in upper respiratory specimens during the acute phase of infection. The lowest concentration of SARS-CoV-2 viral copies this assay can detect is 138 copies/mL. A negative result does not preclude SARS-Cov-2 infection and should not be used as the sole basis for treatment or other patient management decisions. A negative result may occur with  improper specimen collection/handling, submission of specimen other than nasopharyngeal swab, presence of viral mutation(s) within the areas targeted by this assay, and inadequate number of viral copies(<138 copies/mL). A negative result must be combined with clinical observations, patient history, and epidemiological information. The expected result is Negative.  Fact Sheet for Patients:   BloggerCourse.com  Fact Sheet for Healthcare Providers:  SeriousBroker.it  This test is no t yet approved or cleared by the [place] FDA and  has been authorized for detection and/or diagnosis of SARS-CoV-2 by FDA under an Emergency Use Authorization (EUA). This EUA will remain  in effect (meaning this test can be used) for the duration of the COVID-19 declaration under Section 564(b)(1) of the Act, 21 U.S.C.section 360bbb-3(b)(1), unless the authorization is terminated  or revoked sooner.       Influenza A by PCR NEGATIVE NEGATIVE   Influenza B by PCR NEGATIVE NEGATIVE    Comment: (NOTE) The Xpert Xpress SARS-CoV-2/FLU/RSV plus assay is intended as an aid in the diagnosis of influenza from Nasopharyngeal swab specimens and should not be used as a sole basis for treatment. Nasal washings and aspirates are unacceptable for Xpert Xpress SARS-CoV-2/FLU/RSV testing.  Fact Sheet for Patients: BloggerCourse.com  Fact Sheet for Healthcare Providers: SeriousBroker.it  This test is not yet approved or cleared by the [place] FDA and has been authorized for detection and/or diagnosis of SARS-CoV-2 by FDA under an Emergency Use Authorization (EUA). This EUA will remain in effect (meaning this test can be used) for the duration of the COVID-19 declaration under Section 564(b)(1) of the Act, 21 U.S.C. section 360bbb-3(b)(1), unless the authorization is terminated or revoked.  Performed at [facility], [address] [place], [place] [address]   Glucose, capillary     Status: None   Collection Time: 06/22/21  9:59 PM  Result Value Ref Range   Glucose-Capillary 71 70 - 99 mg/dL    Comment: Glucose reference range applies only to samples taken after fasting for at least 8 hours.  Glucose, random  Status: None   Collection Time: 06/22/21 11:55 PM  Result Value  Ref Range   Glucose, Bld 80 70 - 99 mg/dL    Comment: Glucose reference range applies only to samples taken after fasting for at least 8 hours. Performed at [facility], [address] [place], [place] [address]   CBC     Status: Abnormal   Collection Time: 06/22/21 11:55 PM  Result Value Ref Range   WBC 8.6 4.0 - 10.5 K/uL   RBC 4.31 3.87 - 5.11 MIL/uL   Hemoglobin 11.8 (L) 12.0 - 15.0 g/dL   HCT 92.4 (L) 26.8 - 34.1 %   MCV 80.3 80.0 - 100.0 fL   MCH 27.4 26.0 - 34.0 pg   MCHC 34.1 30.0 - 36.0 g/dL   RDW 96.2 22.9 - 79.8 %   Platelets 294 150 - 400 K/uL   nRBC 0.0 0.0 - 0.2 %    Comment: Performed at [facility], [address] [place], [place] [address]  Type and screen     Status: None   Collection Time: 06/22/21 11:55 PM  Result Value Ref Range   ABO/RH(D) A POS    Antibody Screen NEG    Sample Expiration      06/25/2021,2359 Performed at [facility], [address] [place], [place] [address]   RPR     Status: None   Collection Time: 06/22/21 11:55 PM  Result Value Ref Range   RPR Ser Ql NON REACTIVE NON REACTIVE    Comment: Performed at [facility], [address], [place], [place] [address]  CBC     Status: Abnormal   Collection Time: 06/24/21  4:45 AM  Result Value Ref Range   WBC 15.7 (H) 4.0 - 10.5 K/uL   RBC 4.32 3.87 - 5.11 MIL/uL   Hemoglobin 11.5 (L) 12.0 - 15.0 g/dL   HCT 18.5 (L) 63.1 - 49.7 %   MCV 78.9 (L) 80.0 - 100.0 fL   MCH 26.6 26.0 - 34.0 pg   MCHC 33.7 30.0 - 36.0 g/dL   RDW 02.6 37.8 - 58.8 %   Platelets 242 150 - 400 K/uL   nRBC 0.0 0.0 - 0.2 %    Comment: Performed at [facility], [address] [place], [place] [address]    Assessment:   20 y.o. G2P1011 postpartum day # 1, lactating  Plan:    1) Acute blood loss anemia - hemodynamically stable and asymptomatic - po ferrous sulfate  2) Blood Type --/--/A POS (09/12 2355) / Rubella 7.47 (02/16 1550) / Varicella Unknown  3)  TDAP status up to date  4) Feeding plan breast and formula  5) Contraception not discussed  6) Disposition: continue current care   Tresea Mall, CNM [facility] 06/24/2021, 2:29 PM

## 2021-06-24 NOTE — Anesthesia Postprocedure Evaluation (Signed)
Anesthesia Post Note  Patient: Nicole Rocha  Procedure(s) Performed: AN AD HOC LABOR EPIDURAL  Patient location during evaluation: Mother Baby Anesthesia Type: Epidural Level of consciousness: awake and alert and oriented Pain management: pain level controlled Vital Signs Assessment: post-procedure vital signs reviewed and stable Respiratory status: respiratory function stable Cardiovascular status: stable Postop Assessment: no headache, no backache, patient able to bend at knees, no apparent nausea or vomiting, able to ambulate and adequate PO intake Anesthetic complications: no   No notable events documented.   Last Vitals:  Vitals:   06/24/21 0400 06/24/21 0700  BP: 127/81   Pulse: 95   Resp: 18   Temp: (!) 38.3 C 37 C  SpO2: 99%     Last Pain:  Vitals:   06/24/21 0700  TempSrc: Oral  PainSc:                  Zachary George

## 2021-06-24 NOTE — Progress Notes (Signed)
Pt vitals checked upon admission, temp elevated at 100.9. Pt has current orders for antibiotics and tylenol, last doses given at 0100, Will continue to monitor and notify MD if condition changes.

## 2021-06-25 DIAGNOSIS — Z3A40 40 weeks gestation of pregnancy: Secondary | ICD-10-CM

## 2021-06-25 MED ORDER — IBUPROFEN 600 MG PO TABS: 600.0000 mg | ORAL_TABLET | Freq: Four times a day (QID) | ORAL | 0 refills | Status: DC | PRN

## 2021-06-25 MED ORDER — ACETAMINOPHEN 500 MG PO TABS: 1000.0000 mg | ORAL_TABLET | Freq: Four times a day (QID) | ORAL | 0 refills | Status: DC | PRN

## 2021-06-25 NOTE — Progress Notes (Addendum)
Pt discharged with infant. Discharge instructions, prescriptions, and follow up appointments given to and reviewed with patient with interpreter. Pt verbalized understanding. Escorted out by auxillary.  

## 2021-06-25 NOTE — Lactation Note (Signed)
This note was copied from a baby's chart. Lactation Consultation Note  Patient Name: Nicole Rocha [id number] Date: 06/25/2021 Reason for consult: Follow-up assessment;Primapara;Term;Other (Comment) (LGA) Age:22 hours  Lactation visit prior to anticipated discharge. Interpreter ipad ID [id number]. Mom says she breastfed and provided formula throughout the night. She remains tender with baby at the breast, states it's painful.  LC observed both breasts and nipples, no more visible damage, the R nipple appears to have healed from last night. When asked mom states the discomfort is at initial latch. LC reviewed transient nipple discomfort, benefits of the coconut oil. LC also provided education on the consistency of colostrum and how baby has a stronger suction during this time, as milk transitions the suction will also ease.  Reviewed importance of offering breast first with every feeding to ensure stimulation and onset of milk production to meet baby's needs. Reviewed signs that baby is full after the breast or still hungry, before offering formula- it does not have to be automatic.  Reviewed breast fullness/tenderness/engorgement, management and care. Encouraged to call with any questions/concerns.  Maternal Data Has patient been taught Hand Expression?: Yes Does the patient have breastfeeding experience prior to this delivery?: No  Feeding Mother's Current Feeding Choice: Breast Milk and Formula  LATCH Score                    Lactation Tools Discussed/Used Tools: Bottle;Coconut oil  Interventions Interventions: Breast feeding basics reviewed;Hand express;Education;Coconut oil  Discharge Discharge Education: Warning signs for feeding baby;Engorgement and breast care;Outpatient recommendation  Consult Status Consult Status: Complete Date: 06/25/21 Follow-up type: Call as needed    Danford Bad 06/25/2021, 9:57 AM

## 2021-07-02 ENCOUNTER — Telehealth: Payer: Self-pay

## 2021-07-02 NOTE — Telephone Encounter (Signed)
Client called and states delivery on 06/23/2021 with laceration repair. Reports stitches are painful and having burning when she urinates. Desires appt and scheduled for tomorrow. Salli Real interpreted during call. L & D note / discharge summary printed and in outguide for tomorrow's appt. Jossie Ng, RN

## 2021-07-03 ENCOUNTER — Other Ambulatory Visit: Payer: Self-pay

## 2021-07-03 ENCOUNTER — Ambulatory Visit: Payer: Self-pay | Admitting: Family Medicine

## 2021-07-03 NOTE — Progress Notes (Signed)
[facility] Department [address] [facility]    Subjective:    Nicole Rocha is a 20 y.o. female who presents to the clinic status post vaginal  on 06/23/21. Pain is controlled with current analgesics. Medications being used: acetaminophen.  Eating a regular diet without difficulty. Bowel movements are normal. No other significant postoperative concerns.  The following portions of the patient's history were reviewed and updated as appropriate: allergies, current medications, past family history, past medical history, past social history, past surgical history, and problem list..  Last pap smear was LSIL on 11/26/2020.  Review of Systems Pertinent items are noted in HPI.   Objective:   BP 111/74   Wt 184 lb (83.5 kg)   BMI 27.98 kg/m  Constitutional:  Well-developed, well-nourished female in no acute distress.   Skin: Skin is warm and dry, no rash noted, not diaphoretic,no erythema, no pallor.  Cardiovascular: Normal heart rate noted  Respiratory: Effort and breath sounds normal, no problems with respiration noted  Abdomen: Soft, bowel sounds active, non-tender, no abnormal masses  Incision: 2nd degree laceration healing, suture present   Pelvic:   Normal appearing external genitalia; normal appearing vaginal mucosa  No abnormal discharge. Healing well, no drainage, no erythema, no swelling, labia suture visible   Assessment:   Doing well postoperatively.     Plan:  1. Obstetric vaginal laceration with second degree perineal laceration  Continue any current medications.  Wound care discussed.  Evidence of healing, suture present, no erythema, or edema discussed sitz bath and epsom salt baths, instructions given in spanish.   Activity restrictions:  no sex until sutures are gone  Follow up 1 week.   Routine preventative health maintenance measures emphasized.   M. [place] used for Spanish interpretation.     Wendi Snipes, FNP

## 2021-07-03 NOTE — Progress Notes (Signed)
Patient here for suture check, had 2 degree tear at birth 06/23/2021, NSVD. Had painful BM today, and wants perineum/labia checked.Burt Knack, RN

## 2021-07-13 ENCOUNTER — Telehealth: Payer: Self-pay

## 2021-07-13 NOTE — Telephone Encounter (Signed)
Patient was here for 2 week PP check with sore stitches area and was to return for recheck today. Patient did not come for appointment today. TC to patient who states she is feeling much better and does not feel she needs to be seen today or anytime before her 6 week PP appointment. Patient scheduled for PP on 08/05/2021 and states she wants OC for [facility]. Interpreter V. Olmedo.Burt Knack, RN

## 2021-08-05 ENCOUNTER — Ambulatory Visit: Payer: Self-pay

## 2021-08-11 ENCOUNTER — Encounter: Payer: Self-pay | Admitting: Emergency Medicine

## 2021-08-11 ENCOUNTER — Other Ambulatory Visit: Payer: Self-pay

## 2021-08-11 ENCOUNTER — Emergency Department
Admission: EM | Admit: 2021-08-11 | Discharge: 2021-08-11 | Disposition: A | Discharge status: Home / Self Care | Payer: Medicaid Other | Attending: Student in an Organized Health Care Education/Training Program | Admitting: Student in an Organized Health Care Education/Training Program

## 2021-08-11 DIAGNOSIS — M79644 Pain in right finger(s): Secondary | ICD-10-CM | POA: Diagnosis present

## 2021-08-11 DIAGNOSIS — L03011 Cellulitis of right finger: Secondary | ICD-10-CM | POA: Diagnosis not present

## 2021-08-11 MED ORDER — LIDOCAINE HCL (PF) 1 % IJ SOLN
10.0000 mL | Freq: Once | INTRAMUSCULAR | Status: AC
  Administered 2021-08-11: 10 mL
  Filled 2021-08-11: qty 10

## 2021-08-11 MED ORDER — SULFAMETHOXAZOLE-TRIMETHOPRIM 800-160 MG PO TABS
1.0000 | ORAL_TABLET | Freq: Once | ORAL | Status: AC
  Administered 2021-08-11: 1 via ORAL
  Filled 2021-08-11: qty 1

## 2021-08-11 MED ORDER — SULFAMETHOXAZOLE-TRIMETHOPRIM 800-160 MG PO TABS: 1.0000 | ORAL_TABLET | Freq: Two times a day (BID) | ORAL | 0 refills | Status: DC

## 2021-08-11 NOTE — ED Triage Notes (Signed)
Pt via POV from home. Pt c/o R ring finger pain near the nail bed. Swelling noted.  Pt is A&Ox4 and NAD.  Spanish Interpreter Needed.

## 2021-08-11 NOTE — ED Provider Notes (Signed)
[facility] Emergency Department Provider Note  ____________________________________________  Time seen: Approximately 8:56 PM  I have reviewed the triage vital signs and the nursing notes.   HISTORY  Chief Complaint Finger Injury    HPI Nicole Rocha is a 20 y.o. female patient had arrived to the emergency department with a complaint of possible infection to the ring finger of the right hand.  Patient had noticed some erythema, edema and pain noted along the medial and proximal nail.  It has oozed a little bit of purulent drainage.  No limited range of motion.  This is not spread into the finger pad or past the DIP joint.  No history of recurrent skin infections.  No medications prior to arrival.       Past Medical History:  Diagnosis Date   Abnormal Pap smear of cervix 11/26/20 LSIL 03/11/2021    Patient Active Problem List   Diagnosis Date Noted   [redacted] weeks gestation of pregnancy 06/25/2021   Encounter for planned induction of labor 06/22/2021   Abnormal glucose affecting pregnancy - Equivocal 3 hGTT 03/27/2021   Abnormal Pap smear of cervix 11/26/20 LSIL 03/11/2021   Anemia affecting pregnancy 03/11/2021   Supervision of other normal pregnancy, antepartum 11/26/2020    Past Surgical History:  Procedure Laterality Date   NO PAST SURGERIES      Prior to Admission medications   Medication Sig Start Date End Date Taking? Authorizing Provider  sulfamethoxazole-trimethoprim (BACTRIM DS) 800-160 MG tablet Take 1 tablet by mouth 2 (two) times daily. 08/11/21  Yes Ashely Goosby, Delorise Royals, PA-C  acetaminophen (TYLENOL) 500 MG tablet Take 2 tablets (1,000 mg total) by mouth every 6 (six) hours as needed for mild pain. Patient not taking: Reported on 07/03/2021 06/25/21   Conard Novak, MD  ibuprofen (ADVIL) 600 MG tablet Take 1 tablet (600 mg total) by mouth every 6 (six) hours as needed for mild pain, cramping or moderate pain. Patient not taking:  Reported on 07/03/2021 06/25/21   Conard Novak, MD  Prenatal Vit-Fe Fumarate-FA (PRENATAL VITAMIN) 27-0.8 MG TABS Take 1 tablet by mouth daily at 6 (six) AM. Patient not taking: Reported on 07/03/2021 02/11/21   Federico Flake, MD    Allergies Patient has no known allergies.  Family History  Problem Relation Age of Onset   Healthy Mother    Healthy Father    Healthy Brother    Healthy Brother     Social History Social History   Tobacco Use   Smoking status: Never    Passive exposure: Never   Smokeless tobacco: Never   Tobacco comments:    Denies secondhand smoke  Vaping Use   Vaping Use: Never used  Substance Use Topics   Alcohol use: Not Currently    Comment: occasionally before pregnancy   Drug use: Not Currently     Review of Systems  Constitutional: No fever/chills Eyes: No visual changes. No discharge ENT: No upper respiratory complaints. Cardiovascular: no chest pain. Respiratory: no cough. No SOB. Gastrointestinal: No abdominal pain.  No nausea, no vomiting.  No diarrhea.  No constipation. Musculoskeletal: Possible finger infection to the ring finger of the right hand Skin: Negative for rash, abrasions, lacerations, ecchymosis. Neurological: Negative for headaches, focal weakness or numbness.  10 System ROS otherwise negative.  ____________________________________________   PHYSICAL EXAM:  VITAL SIGNS: ED Triage Vitals  Enc Vitals Group     BP 08/11/21 1748 104/73     Pulse Rate 08/11/21 1748 88  Resp 08/11/21 1748 20     Temp 08/11/21 1748 98.3 F (36.8 C)     Temp Source 08/11/21 1748 Oral     SpO2 08/11/21 1748 100 %     Weight 08/11/21 1751 180 lb (81.6 kg)     Height 08/11/21 1751 5\' 8"  (1.727 m)     Head Circumference --      Peak Flow --      Pain Score 08/11/21 1750 5     Pain Loc --      Pain Edu? --      Excl. in GC? --      Constitutional: Alert and oriented. Well appearing and in no acute distress. Eyes:  Conjunctivae are normal. PERRL. EOMI. Head: Atraumatic. ENT:      Ears:       Nose: No congestion/rhinnorhea.      Mouth/Throat: Mucous membranes are moist.  Neck: No stridor.    Cardiovascular: Normal rate, regular rhythm. Normal S1 and S2.  Good peripheral circulation. Respiratory: Normal respiratory effort without tachypnea or retractions. Lungs CTAB. Good air entry to the bases with no decreased or absent breath sounds. Musculoskeletal: Full range of motion to all extremities. No gross deformities appreciated.  Visualization of the right ring finger revealed findings consistent with paronychia.  Full range of motion intact to the digit.  No sausage digit.  No extension into the pad to be concern for felon's.  There is some dried drainage along the medial nailbed Neurologic:  Normal speech and language. No gross focal neurologic deficits are appreciated.  Skin:  Skin is warm, dry and intact. No rash noted. Psychiatric: Mood and affect are normal. Speech and behavior are normal. Patient exhibits appropriate insight and judgement.   ____________________________________________   LABS (all labs ordered are listed, but only abnormal results are displayed)  Labs Reviewed - No data to display ____________________________________________  EKG   ____________________________________________  RADIOLOGY   No results found.  ____________________________________________    PROCEDURES  Procedure(s) performed:    13/01/22Marland KitchenIncision and Drainage  Date/Time: 08/11/2021 9:33 PM Performed by: 13/10/2020, PA-C Authorized by: Racheal Patches, PA-C   Consent:    Consent obtained:  Verbal   Consent given by:  Patient   Risks discussed:  Bleeding and incomplete drainage   Alternatives discussed:  No treatment Universal protocol:    Procedure explained and questions answered to patient or proxy's satisfaction: yes     Immediately prior to procedure, a time out was called: yes      Patient identity confirmed:  Verbally with patient Location:    Indications for incision and drainage: paronychia. Pre-procedure details:    Skin preparation:  Chlorhexidine with alcohol Sedation:    Sedation type:  None Anesthesia:    Anesthesia method:  Nerve block   Block location:  Ring finger R hand   Block needle gauge:  27 G   Block anesthetic:  Lidocaine 1% w/o epi   Block technique:  Digital block   Block injection procedure:  Anatomic landmarks identified, introduced needle, negative aspiration for blood and incremental injection   Block outcome:  Anesthesia achieved Procedure type:    Complexity:  Simple Procedure details:    Incision types:  Stab incision   Incision depth:  Subcutaneous   Drainage:  Bloody and purulent   Drainage amount:  Moderate   Wound treatment:  Wound left open Post-procedure details:    Procedure completion:  Tolerated well, no immediate complications  Medications  lidocaine (PF) (XYLOCAINE) 1 % injection 10 mL (has no administration in time range)  sulfamethoxazole-trimethoprim (BACTRIM DS) 800-160 MG per tablet 1 tablet (has no administration in time range)     ____________________________________________   INITIAL IMPRESSION / ASSESSMENT AND PLAN / ED COURSE  Pertinent labs & imaging results that were available during my care of the patient were reviewed by me and considered in my medical decision making (see chart for details).  Review of the [place] CSRS was performed in accordance of the [facility] prior to dispensing any controlled drugs.           Patient's diagnosis is consistent with paronychia.  Patient presented to the emergency department with possible finger infection.  Patient had findings consistent with paronychia that was drained at bedside.  Patient preferred anesthesia prior to drainage so I performed a digital block at patient's request.  Moderate amount of purulent drainage was expressed.  Finger is wrapped with  gauze.  Patient will be placed on antibiotics with first dose here in the ED.  Follow-up with primary care as needed.  Return precautions discussed with the patient.. Patient is given ED precautions to return to the ED for any worsening or new symptoms.     ____________________________________________  FINAL CLINICAL IMPRESSION(S) / ED DIAGNOSES  Final diagnoses:  Paronychia of finger of right hand      NEW MEDICATIONS STARTED DURING THIS VISIT:  ED Discharge Orders          Ordered    sulfamethoxazole-trimethoprim (BACTRIM DS) 800-160 MG tablet  2 times daily        08/11/21 2139                This chart was dictated using voice recognition software/Dragon. Despite best efforts to proofread, errors can occur which can change the meaning. Any change was purely unintentional.    Lanette Hampshire 08/11/21 2139    Willy Eddy, MD 08/11/21 2239

## 2021-08-11 NOTE — ED Notes (Signed)
ED Provider at bedside. 

## 2021-08-11 NOTE — ED Notes (Signed)
Discharge with use of translator [phone], [employer]

## 2021-08-18 ENCOUNTER — Other Ambulatory Visit: Payer: Self-pay

## 2021-08-18 ENCOUNTER — Ambulatory Visit (LOCAL_COMMUNITY_HEALTH_CENTER): Payer: Self-pay | Admitting: Advanced Practice Midwife

## 2021-08-18 VITALS — BP 103/70 | Ht 65.5 in | Wt 184.6 lb

## 2021-08-18 DIAGNOSIS — Z3009 Encounter for other general counseling and advice on contraception: Secondary | ICD-10-CM

## 2021-08-18 LAB — PREGNANCY, URINE: Preg Test, Ur: NEGATIVE

## 2021-08-18 NOTE — Progress Notes (Signed)
Pt here for PT and IUD placement.  Pt states that she had a positive home pregnancy test on 08/13/2021.  Urine pregnancy test was ordered and pt notified that it was negative.  Pt states that she has had unprotected sex 4 times in the last 10 days.  Appointment will be rescheduled for 2-3 weeks out.  Pt informed no sex until her appointment and that she may take Ibuprofen before IUD placement. Berdie Ogren, RN

## 2021-09-08 ENCOUNTER — Ambulatory Visit: Payer: Self-pay

## 2021-09-14 ENCOUNTER — Emergency Department: Payer: Medicaid Other

## 2021-09-14 ENCOUNTER — Observation Stay
Admission: EM | Admit: 2021-09-14 | Discharge: 2021-09-16 | Disposition: A | Discharge status: Home / Self Care | Payer: Medicaid Other | Attending: Surgery | Admitting: Surgery

## 2021-09-14 ENCOUNTER — Encounter: Payer: Self-pay | Admitting: Emergency Medicine

## 2021-09-14 ENCOUNTER — Encounter
Admission: EM | Disposition: A | Discharge status: Home / Self Care | Payer: Self-pay | Source: Home / Self Care | Attending: Emergency Medicine

## 2021-09-14 ENCOUNTER — Other Ambulatory Visit: Payer: Self-pay

## 2021-09-14 ENCOUNTER — Observation Stay: Payer: Medicaid Other | Admitting: Anesthesiology

## 2021-09-14 DIAGNOSIS — R1031 Right lower quadrant pain: Secondary | ICD-10-CM | POA: Diagnosis present

## 2021-09-14 DIAGNOSIS — K3580 Unspecified acute appendicitis: Secondary | ICD-10-CM | POA: Diagnosis present

## 2021-09-14 DIAGNOSIS — Z20822 Contact with and (suspected) exposure to covid-19: Secondary | ICD-10-CM | POA: Diagnosis not present

## 2021-09-14 DIAGNOSIS — K353 Acute appendicitis with localized peritonitis, without perforation or gangrene: Principal | ICD-10-CM | POA: Insufficient documentation

## 2021-09-14 DIAGNOSIS — A419 Sepsis, unspecified organism: Secondary | ICD-10-CM | POA: Diagnosis not present

## 2021-09-14 HISTORY — PX: XI ROBOTIC LAPAROSCOPIC ASSISTED APPENDECTOMY: SHX6877

## 2021-09-14 HISTORY — DX: Unspecified acute appendicitis: K35.80

## 2021-09-14 LAB — CBC
HCT: 37.7 % (ref 36.0–46.0)
Hemoglobin: 12.7 g/dL (ref 12.0–15.0)
MCH: 27.3 pg (ref 26.0–34.0)
MCHC: 33.7 g/dL (ref 30.0–36.0)
MCV: 81.1 fL (ref 80.0–100.0)
Platelets: 325 10*3/uL (ref 150–400)
RBC: 4.65 MIL/uL (ref 3.87–5.11)
RDW: 15.3 % (ref 11.5–15.5)
WBC: 16.3 10*3/uL — ABNORMAL HIGH (ref 4.0–10.5)
nRBC: 0 % (ref 0.0–0.2)

## 2021-09-14 LAB — MAGNESIUM: Magnesium: 1.5 mg/dL — ABNORMAL LOW (ref 1.7–2.4)

## 2021-09-14 LAB — COMPREHENSIVE METABOLIC PANEL
ALT: 26 U/L (ref 0–44)
AST: 27 U/L (ref 15–41)
Albumin: 4.4 g/dL (ref 3.5–5.0)
Alkaline Phosphatase: 75 U/L (ref 38–126)
Anion gap: 8 (ref 5–15)
BUN: 15 mg/dL (ref 6–20)
CO2: 22 mmol/L (ref 22–32)
Calcium: 9.1 mg/dL (ref 8.9–10.3)
Chloride: 103 mmol/L (ref 98–111)
Creatinine, Ser: 0.48 mg/dL (ref 0.44–1.00)
GFR, Estimated: >60 mL/min (ref >60)
Glucose, Bld: 113 mg/dL — ABNORMAL HIGH (ref 70–99)
Potassium: 3.6 mmol/L (ref 3.5–5.1)
Sodium: 133 mmol/L — ABNORMAL LOW (ref 135–145)
Total Bilirubin: 0.7 mg/dL (ref 0.3–1.2)
Total Protein: 8 g/dL (ref 6.5–8.1)

## 2021-09-14 LAB — URINALYSIS, ROUTINE W REFLEX MICROSCOPIC
Bilirubin Urine: NEGATIVE
Glucose, UA: NEGATIVE mg/dL
Ketones, ur: NEGATIVE mg/dL
Nitrite: NEGATIVE
Protein, ur: NEGATIVE mg/dL
Specific Gravity, Urine: 1.017 (ref 1.005–1.030)
pH: 6 (ref 5.0–8.0)

## 2021-09-14 LAB — HCG, QUANTITATIVE, PREGNANCY: hCG, Beta Chain, Quant, S: <1 m[IU]/mL (ref <5)

## 2021-09-14 LAB — LACTIC ACID, PLASMA
Lactic Acid, Venous: 1.4 mmol/L (ref 0.5–1.9)
Lactic Acid, Venous: 1.3 mmol/L (ref 0.5–1.9)

## 2021-09-14 LAB — T4, FREE: Free T4: 0.86 ng/dL (ref 0.61–1.12)

## 2021-09-14 LAB — LIPASE, BLOOD: Lipase: 29 U/L (ref 11–51)

## 2021-09-14 LAB — POC URINE PREG, ED: Preg Test, Ur: NEGATIVE

## 2021-09-14 LAB — RESP PANEL BY RT-PCR (FLU A&B, COVID) ARPGX2
Influenza A by PCR: NEGATIVE
Influenza B by PCR: NEGATIVE
SARS Coronavirus 2 by RT PCR: NEGATIVE

## 2021-09-14 LAB — TSH: TSH: 0.675 u[IU]/mL (ref 0.350–4.500)

## 2021-09-14 SURGERY — APPENDECTOMY, ROBOT-ASSISTED, LAPAROSCOPIC
Anesthesia: General | Site: Abdomen

## 2021-09-14 MED ORDER — PROMETHAZINE HCL 25 MG/ML IJ SOLN: 6.2500 mg | INTRAMUSCULAR | Status: DC | PRN

## 2021-09-14 MED ORDER — ACETAMINOPHEN 10 MG/ML IV SOLN
INTRAVENOUS | Status: DC | PRN
  Administered 2021-09-14: 1000 mg via INTRAVENOUS

## 2021-09-14 MED ORDER — FENTANYL CITRATE (PF) 100 MCG/2ML IJ SOLN
INTRAMUSCULAR | Status: AC
  Filled 2021-09-14: qty 2

## 2021-09-14 MED ORDER — HYDROCODONE-ACETAMINOPHEN 5-325 MG PO TABS
1.0000 | ORAL_TABLET | ORAL | Status: DC | PRN
  Administered 2021-09-15: 1 via ORAL
  Administered 2021-09-15: 2 via ORAL
  Administered 2021-09-15: 1 via ORAL
  Filled 2021-09-14: qty 1
  Filled 2021-09-14: qty 2
  Filled 2021-09-14: qty 1

## 2021-09-14 MED ORDER — ONDANSETRON HCL 4 MG/2ML IJ SOLN
4.0000 mg | Freq: Once | INTRAMUSCULAR | Status: AC
  Administered 2021-09-14: 4 mg via INTRAVENOUS
  Filled 2021-09-14: qty 2

## 2021-09-14 MED ORDER — LIDOCAINE HCL (CARDIAC) PF 100 MG/5ML IV SOSY
PREFILLED_SYRINGE | INTRAVENOUS | Status: DC | PRN
  Administered 2021-09-14: 80 mg via INTRAVENOUS

## 2021-09-14 MED ORDER — PROPOFOL 10 MG/ML IV BOLUS
INTRAVENOUS | Status: DC | PRN
  Administered 2021-09-14: 170 mg via INTRAVENOUS

## 2021-09-14 MED ORDER — 0.9 % SODIUM CHLORIDE (POUR BTL) OPTIME
TOPICAL | Status: DC | PRN
  Administered 2021-09-14: 10 mL

## 2021-09-14 MED ORDER — SUGAMMADEX SODIUM 200 MG/2ML IV SOLN
INTRAVENOUS | Status: DC | PRN
  Administered 2021-09-14: 200 mg via INTRAVENOUS

## 2021-09-14 MED ORDER — KETOROLAC TROMETHAMINE 30 MG/ML IJ SOLN
INTRAMUSCULAR | Status: DC | PRN
Start: 2021-09-14 — End: 2021-09-14
  Administered 2021-09-14: 30 mg via INTRAVENOUS

## 2021-09-14 MED ORDER — TRAMADOL HCL 50 MG PO TABS: 50.0000 mg | ORAL_TABLET | Freq: Four times a day (QID) | ORAL | Status: DC | PRN

## 2021-09-14 MED ORDER — ONDANSETRON HCL 4 MG/2ML IJ SOLN: 4.0000 mg | Freq: Four times a day (QID) | INTRAMUSCULAR | Status: DC | PRN

## 2021-09-14 MED ORDER — MORPHINE SULFATE (PF) 2 MG/ML IV SOLN: 2.0000 mg | INTRAVENOUS | Status: DC | PRN

## 2021-09-14 MED ORDER — MIDAZOLAM HCL 2 MG/2ML IJ SOLN
INTRAMUSCULAR | Status: AC
  Filled 2021-09-14: qty 2

## 2021-09-14 MED ORDER — ACETAMINOPHEN 10 MG/ML IV SOLN
INTRAVENOUS | Status: AC
  Filled 2021-09-14: qty 100

## 2021-09-14 MED ORDER — PROPOFOL 10 MG/ML IV BOLUS
INTRAVENOUS | Status: AC
  Filled 2021-09-14: qty 20

## 2021-09-14 MED ORDER — ACETAMINOPHEN 10 MG/ML IV SOLN: 1000.0000 mg | Freq: Once | INTRAVENOUS | Status: DC | PRN

## 2021-09-14 MED ORDER — SODIUM CHLORIDE 0.9 % IV BOLUS
1000.0000 mL | Freq: Once | INTRAVENOUS | Status: AC
  Administered 2021-09-14: 1000 mL via INTRAVENOUS

## 2021-09-14 MED ORDER — HYDROMORPHONE HCL 1 MG/ML IJ SOLN
0.5000 mg | INTRAMUSCULAR | Status: AC
  Administered 2021-09-14: 0.5 mg via INTRAVENOUS

## 2021-09-14 MED ORDER — OXYCODONE HCL 5 MG PO TABS
5.0000 mg | ORAL_TABLET | Freq: Once | ORAL | Status: AC | PRN
  Administered 2021-09-14: 5 mg via ORAL

## 2021-09-14 MED ORDER — FENTANYL CITRATE (PF) 100 MCG/2ML IJ SOLN: 25.0000 ug | INTRAMUSCULAR | Status: DC | PRN

## 2021-09-14 MED ORDER — PIPERACILLIN-TAZOBACTAM 3.375 G IVPB 30 MIN
3.3750 g | Freq: Once | INTRAVENOUS | Status: AC
  Administered 2021-09-14: 3.375 g via INTRAVENOUS
  Filled 2021-09-14: qty 50

## 2021-09-14 MED ORDER — DEXAMETHASONE SODIUM PHOSPHATE 10 MG/ML IJ SOLN
INTRAMUSCULAR | Status: AC
  Filled 2021-09-14: qty 1

## 2021-09-14 MED ORDER — FENTANYL CITRATE (PF) 100 MCG/2ML IJ SOLN
INTRAMUSCULAR | Status: DC | PRN
  Administered 2021-09-14 (×2): 50 ug via INTRAVENOUS

## 2021-09-14 MED ORDER — LACTATED RINGERS IV SOLN: INTRAVENOUS | Status: DC | PRN

## 2021-09-14 MED ORDER — FENTANYL CITRATE PF 50 MCG/ML IJ SOSY
50.0000 ug | PREFILLED_SYRINGE | Freq: Once | INTRAMUSCULAR | Status: AC
  Administered 2021-09-14: 50 ug via INTRAVENOUS
  Filled 2021-09-14: qty 1

## 2021-09-14 MED ORDER — DOCUSATE SODIUM 100 MG PO CAPS: 100.0000 mg | ORAL_CAPSULE | Freq: Two times a day (BID) | ORAL | Status: DC | PRN

## 2021-09-14 MED ORDER — ENOXAPARIN SODIUM 40 MG/0.4ML IJ SOSY
40.0000 mg | PREFILLED_SYRINGE | INTRAMUSCULAR | Status: DC
  Administered 2021-09-15: 40 mg via SUBCUTANEOUS
  Filled 2021-09-14: qty 0.4

## 2021-09-14 MED ORDER — MAGNESIUM SULFATE 2 GM/50ML IV SOLN
2.0000 g | Freq: Once | INTRAVENOUS | Status: AC
  Administered 2021-09-14: 2 g via INTRAVENOUS
  Filled 2021-09-14: qty 50

## 2021-09-14 MED ORDER — ONDANSETRON 4 MG PO TBDP
4.0000 mg | ORAL_TABLET | Freq: Four times a day (QID) | ORAL | Status: DC | PRN
  Administered 2021-09-15: 4 mg via ORAL
  Filled 2021-09-14 (×2): qty 1

## 2021-09-14 MED ORDER — ONDANSETRON HCL 4 MG/2ML IJ SOLN
INTRAMUSCULAR | Status: AC
  Filled 2021-09-14: qty 2

## 2021-09-14 MED ORDER — LIDOCAINE HCL (PF) 1 % IJ SOLN
INTRAMUSCULAR | Status: DC | PRN
  Administered 2021-09-14: 15 mL via INTRAMUSCULAR

## 2021-09-14 MED ORDER — ROCURONIUM BROMIDE 10 MG/ML (PF) SYRINGE
PREFILLED_SYRINGE | INTRAVENOUS | Status: AC
  Filled 2021-09-14: qty 10

## 2021-09-14 MED ORDER — IOHEXOL 300 MG/ML  SOLN
100.0000 mL | Freq: Once | INTRAMUSCULAR | Status: AC | PRN
  Administered 2021-09-14: 100 mL via INTRAVENOUS
  Filled 2021-09-14: qty 100

## 2021-09-14 MED ORDER — ONDANSETRON HCL 4 MG/2ML IJ SOLN
INTRAMUSCULAR | Status: DC | PRN
  Administered 2021-09-14: 4 mg via INTRAVENOUS

## 2021-09-14 MED ORDER — PHENYLEPHRINE 40 MCG/ML (10ML) SYRINGE FOR IV PUSH (FOR BLOOD PRESSURE SUPPORT)
PREFILLED_SYRINGE | INTRAVENOUS | Status: DC | PRN
  Administered 2021-09-14 (×2): 160 ug via INTRAVENOUS

## 2021-09-14 MED ORDER — LIDOCAINE HCL (PF) 2 % IJ SOLN
INTRAMUSCULAR | Status: AC
  Filled 2021-09-14: qty 5

## 2021-09-14 MED ORDER — OXYCODONE HCL 5 MG/5ML PO SOLN: 5.0000 mg | Freq: Once | ORAL | Status: AC | PRN

## 2021-09-14 MED ORDER — ROCURONIUM BROMIDE 100 MG/10ML IV SOLN
INTRAVENOUS | Status: DC | PRN
  Administered 2021-09-14: 60 mg via INTRAVENOUS

## 2021-09-14 MED ORDER — DEXAMETHASONE SODIUM PHOSPHATE 10 MG/ML IJ SOLN
INTRAMUSCULAR | Status: DC | PRN
  Administered 2021-09-14: 8 mg via INTRAVENOUS

## 2021-09-14 SURGICAL SUPPLY — 64 items
ANCHOR TIS RET SYS 235ML (MISCELLANEOUS) ×2 IMPLANT
BAG INFUSER PRESSURE 100CC (MISCELLANEOUS) IMPLANT
BLADE SURG SZ11 CARB STEEL (BLADE) ×2 IMPLANT
BULB RESERV EVAC DRAIN JP 100C (MISCELLANEOUS) IMPLANT
CANNULA REDUC XI 12-8 STAPL (CANNULA) ×1
CANNULA REDUCER 12-8 DVNC XI (CANNULA) ×1 IMPLANT
CHLORAPREP W/TINT 26 (MISCELLANEOUS) IMPLANT
COVER TIP SHEARS 8 DVNC (MISCELLANEOUS) ×1 IMPLANT
COVER TIP SHEARS 8MM DA VINCI (MISCELLANEOUS) ×1
DEFOGGER SCOPE WARMER CLEARIFY (MISCELLANEOUS) ×2 IMPLANT
DERMABOND ADVANCED (GAUZE/BANDAGES/DRESSINGS) ×1
DERMABOND ADVANCED .7 DNX12 (GAUZE/BANDAGES/DRESSINGS) ×1 IMPLANT
DRAIN CHANNEL JP 15F RND 16 (MISCELLANEOUS) IMPLANT
DRAPE ARM DVNC X/XI (DISPOSABLE) ×3 IMPLANT
DRAPE COLUMN DVNC XI (DISPOSABLE) ×1 IMPLANT
DRAPE DA VINCI XI ARM (DISPOSABLE) ×3
DRAPE DA VINCI XI COLUMN (DISPOSABLE) ×1
ELECT CAUTERY BLADE 6.4 (BLADE) ×2 IMPLANT
ELECT REM PT RETURN 9FT ADLT (ELECTROSURGICAL) ×2
ELECTRODE REM PT RTRN 9FT ADLT (ELECTROSURGICAL) ×1 IMPLANT
GAUZE 4X4 16PLY ~~LOC~~+RFID DBL (SPONGE) ×2 IMPLANT
GLOVE SURG SYN 6.5 ES PF (GLOVE) ×6 IMPLANT
GLOVE SURG UNDER POLY LF SZ7 (GLOVE) ×6 IMPLANT
GOWN STRL REUS W/ TWL LRG LVL3 (GOWN DISPOSABLE) ×3 IMPLANT
GOWN STRL REUS W/TWL LRG LVL3 (GOWN DISPOSABLE) ×3
GRASPER SUT TROCAR 14GX15 (MISCELLANEOUS) ×2 IMPLANT
IRRIGATOR SUCT 8 DISP DVNC XI (IRRIGATION / IRRIGATOR) IMPLANT
IRRIGATOR SUCTION 8MM XI DISP (IRRIGATION / IRRIGATOR)
IV NS 1000ML (IV SOLUTION)
IV NS 1000ML BAXH (IV SOLUTION) IMPLANT
JACKSON PRATT 7MM (INSTRUMENTS) IMPLANT
KIT TURNOVER KIT A (KITS) ×2 IMPLANT
LABEL OR SOLS (LABEL) IMPLANT
MANIFOLD NEPTUNE II (INSTRUMENTS) IMPLANT
NEEDLE HYPO 22GX1.5 SAFETY (NEEDLE) ×2 IMPLANT
NEEDLE INSUFFLATION 14GA 120MM (NEEDLE) ×2 IMPLANT
OBTURATOR OPTICAL STANDARD 8MM (TROCAR) ×1
OBTURATOR OPTICAL STND 8 DVNC (TROCAR) ×1
OBTURATOR OPTICALSTD 8 DVNC (TROCAR) ×1 IMPLANT
PACK LAP CHOLECYSTECTOMY (MISCELLANEOUS) ×2 IMPLANT
PENCIL ELECTRO HAND CTR (MISCELLANEOUS) ×2 IMPLANT
RELOAD STAPLER 2.5X45 WHT DVNC (STAPLE) ×1 IMPLANT
RELOAD STAPLER 3.5X45 BLU DVNC (STAPLE) ×1 IMPLANT
SEAL CANN UNIV 5-8 DVNC XI (MISCELLANEOUS) ×3 IMPLANT
SEAL XI 5MM-8MM UNIVERSAL (MISCELLANEOUS) ×3
SET TUBE SMOKE EVAC HIGH FLOW (TUBING) ×2 IMPLANT
SOLUTION ELECTROLUBE (MISCELLANEOUS) ×2 IMPLANT
STAPLER 45 DA VINCI SURE FORM (STAPLE) ×1
STAPLER 45 SUREFORM DVNC (STAPLE) ×1 IMPLANT
STAPLER CANNULA SEAL DVNC XI (STAPLE) ×1 IMPLANT
STAPLER CANNULA SEAL XI (STAPLE) ×1
STAPLER RELOAD 2.5X45 WHITE (STAPLE) ×1
STAPLER RELOAD 2.5X45 WHT DVNC (STAPLE) ×1
STAPLER RELOAD 3.5X45 BLU DVNC (STAPLE) ×1
STAPLER RELOAD 3.5X45 BLUE (STAPLE) ×1
SUT ETHILON 3-0 FS-10 30 BLK (SUTURE)
SUT MNCRL AB 4-0 PS2 18 (SUTURE) ×4 IMPLANT
SUT VIC AB 3-0 SH 27 (SUTURE)
SUT VIC AB 3-0 SH 27X BRD (SUTURE) IMPLANT
SUT VICRYL 0 AB UR-6 (SUTURE) ×2 IMPLANT
SUTURE EHLN 3-0 FS-10 30 BLK (SUTURE) IMPLANT
SYR 30ML LL (SYRINGE) ×2 IMPLANT
SYSTEM WECK SHIELD CLOSURE (TROCAR) IMPLANT
WATER STERILE IRR 500ML POUR (IV SOLUTION) ×2 IMPLANT

## 2021-09-14 NOTE — ED Notes (Signed)
Consent at bedside. Report given to [facility] OR.

## 2021-09-14 NOTE — Anesthesia Preprocedure Evaluation (Addendum)
Anesthesia Evaluation  Patient identified by MRN, date of birth, ID band Patient awake    Reviewed: Allergy & Precautions, H&P , NPO status , Patient's Chart, lab work & pertinent test results  Airway Mallampati: III  TM Distance: >3 FB Neck ROM: Full    Dental no notable dental hx.    Pulmonary neg pulmonary ROS,    Pulmonary exam normal        Cardiovascular Exercise Tolerance: Good Normal cardiovascular exam  EKG 09/14/2021: Right bundle branch block   Neuro/Psych negative neurological ROS  negative psych ROS   GI/Hepatic Neg liver ROS, acute appendicitis Associated with n/v  Rectal Bleeding   Endo/Other  negative endocrine ROS  Renal/GU negative Renal ROS  negative genitourinary   Musculoskeletal negative musculoskeletal ROS (+)   Abdominal (+)  Abdomen: soft and tender.    Peds negative pediatric ROS (+)  Hematology negative hematology ROS (+)   Anesthesia Other Findings   Reproductive/Obstetrics negative OB ROS                            Anesthesia Physical Anesthesia Plan  ASA: 2  Anesthesia Plan: General ETT   Post-op Pain Management:    Induction: Intravenous and Rapid sequence  PONV Risk Score and Plan: Ondansetron, Dexamethasone and Midazolam  Airway Management Planned: Oral ETT  Additional Equipment:   Intra-op Plan:   Post-operative Plan: Extubation in OR  Informed Consent: I have reviewed the patients History and Physical, chart, labs and discussed the procedure including the risks, benefits and alternatives for the proposed anesthesia with the patient or authorized representative who has indicated his/her understanding and acceptance.     Dental advisory given  Plan Discussed with: Anesthesiologist, CRNA and Surgeon  Anesthesia Plan Comments: (Patient consented for risks of anesthesia including but not limited to:  - adverse reactions to  medications - damage to eyes, teeth, lips or other oral mucosa - nerve damage due to positioning  - sore throat or hoarseness - Damage to heart, brain, nerves, lungs, other parts of body or loss of life  Patient voiced understanding.)       Anesthesia Quick Evaluation

## 2021-09-14 NOTE — H&P (Signed)
Subjective:   CC: acute appendicitis  HPI:  Nicole Rocha is a 20 y.o. female who is consulted by [facility] for evaluation of  above cc.  Symptoms were first noted 1 day ago. Pain is sharp, periumbilical then RLQ.  Associated with n/v, exacerbated by nothing specific.     Past Medical History:  has a past medical history of Abnormal Pap smear of cervix 11/26/20 LSIL (03/11/2021).  Past Surgical History:  has a past surgical history that includes No past surgeries.  Family History: family history includes Healthy in her brother, brother, father, and mother.  Social History:  reports that she has never smoked. She has never been exposed to tobacco smoke. She has never used smokeless tobacco. She reports that she does not currently use alcohol. She reports that she does not currently use drugs.  Current Medications:  Prior to Admission medications   Medication Sig Start Date End Date Taking? Authorizing Provider  acetaminophen (TYLENOL) 500 MG tablet Take 2 tablets (1,000 mg total) by mouth every 6 (six) hours as needed for mild pain. Patient not taking: Reported on 07/03/2021 06/25/21   Will Bonnet, MD  ibuprofen (ADVIL) 600 MG tablet Take 1 tablet (600 mg total) by mouth every 6 (six) hours as needed for mild pain, cramping or moderate pain. Patient not taking: Reported on 07/03/2021 06/25/21   Will Bonnet, MD  Prenatal Vit-Fe Fumarate-FA (PRENATAL VITAMIN) 27-0.8 MG TABS Take 1 tablet by mouth daily at 6 (six) AM. Patient not taking: Reported on 07/03/2021 02/11/21   Caren Macadam, MD  sulfamethoxazole-trimethoprim (BACTRIM DS) 800-160 MG tablet Take 1 tablet by mouth 2 (two) times daily. Patient not taking: Reported on 09/14/2021 08/11/21   Cuthriell, Charline Bills, PA-C    Allergies:  Allergies as of 09/14/2021   (No Known Allergies)    ROS:  General: Denies weight loss, weight gain, fatigue, fevers, chills, and night sweats. Eyes: Denies blurry vision, double  vision, eye pain, itchy eyes, and tearing. Ears: Denies hearing loss, earache, and ringing in ears. Nose: Denies sinus pain, congestion, infections, runny nose, and nosebleeds. Mouth/throat: Denies hoarseness, sore throat, bleeding gums, and difficulty swallowing. Heart: Denies chest pain, palpitations, racing heart, irregular heartbeat, leg pain or swelling, and decreased activity tolerance. Respiratory: Denies breathing difficulty, shortness of breath, wheezing, cough, and sputum. GI: Denies change in appetite, heartburn, constipation, diarrhea, and blood in stool. GU: Denies difficulty urinating, pain with urinating, urgency, frequency, blood in urine. Musculoskeletal: Denies joint stiffness, pain, swelling, muscle weakness. Skin: Denies rash, itching, mass, tumors, sores, and boils Neurologic: Denies headache, fainting, dizziness, seizures, numbness, and tingling. Psychiatric: Denies depression, anxiety, difficulty sleeping, and memory loss. Endocrine: Denies heat or cold intolerance, and increased thirst or urination. Blood/lymph: Denies easy bruising, easy bruising, and swollen glands     Objective:     BP 115/67 (BP Location: Right Arm)   Pulse (!) 117   Temp 99.4 F (37.4 C) (Oral)   Resp 20   Ht 5\' 7"  (1.702 m)   Wt 83.5 kg   SpO2 100%   Breastfeeding No   BMI 28.82 kg/m    Constitutional :  alert, cooperative, appears stated age, and no distress  Lymphatics/Throat:  no asymmetry, masses, or scars  Respiratory:  clear to auscultation bilaterally  Cardiovascular:  regular rate and rhythm  Gastrointestinal: Soft, TTP in RLQ .   Musculoskeletal: Steady gait and movement  Skin: Cool and moist, no surgical scars  Psychiatric: Normal affect, non-agitated, not confused  LABS:  CMP Latest Ref Rng & Units 09/14/2021 06/22/2021 02/13/2021  Glucose 70 - 99 mg/dL 751(W) 80 258(N)  BUN 6 - 20 mg/dL 15 - 8  Creatinine 2.77 - 1.00 mg/dL 8.24 - 2.35(T)  Sodium 135 - 145  mmol/L 133(L) - 134(L)  Potassium 3.5 - 5.1 mmol/L 3.6 - 3.5  Chloride 98 - 111 mmol/L 103 - 103  CO2 22 - 32 mmol/L 22 - 22  Calcium 8.9 - 10.3 mg/dL 9.1 - 8.9  Total Protein 6.5 - 8.1 g/dL 8.0 - 6.8  Total Bilirubin 0.3 - 1.2 mg/dL 0.7 - 0.5  Alkaline Phos 38 - 126 U/L 75 - 65  AST 15 - 41 U/L 27 - 20  ALT 0 - 44 U/L 26 - 17   CBC Latest Ref Rng & Units 09/14/2021 06/24/2021 06/22/2021  WBC 4.0 - 10.5 K/uL 16.3(H) 15.7(H) 8.6  Hemoglobin 12.0 - 15.0 g/dL 61.4 11.5(L) 11.8(L)  Hematocrit 36.0 - 46.0 % 37.7 34.1(L) 34.6(L)  Platelets 150 - 400 K/uL 325 242 294     RADS: CLINICAL DATA:  Abdominal pain since this a.m.   EXAM: CT ABDOMEN AND PELVIS WITH CONTRAST   TECHNIQUE: Multidetector CT imaging of the abdomen and pelvis was performed using the standard protocol following bolus administration of intravenous contrast.   CONTRAST:  OMNIPAQUE IOHEXOL 300 MG/ML  SOLN   COMPARISON:  None.   FINDINGS: Lower chest: No acute abnormality.   Hepatobiliary: No suspicious hepatic lesion. Gallbladder is unremarkable. No biliary ductal dilation.   Pancreas: No pancreatic ductal dilation or evidence of acute inflammation.   Spleen: Within normal limits.   Adrenals/Urinary Tract: Bilateral adrenal glands are unremarkable. No hydronephrosis. No solid enhancing renal mass. Kidneys demonstrate symmetric enhancement. Mild wall thickening of an incompletely distended urinary bladder.   Stomach/Bowel: No enteric contrast was administered. Stomach is decompressed limiting evaluation. No pathologic dilation of small or large bowel. Terminal ileum appears normal.   The appendix is dilated and inflamed measuring up to 12 mm.   Vascular/Lymphatic: No significant vascular findings are present. No enlarged abdominal or pelvic lymph nodes.   Reproductive: Uterus is unremarkable. 2.5 cm right ovarian cyst and a 2.2 cm left ovarian cyst. No follow-up imaging is  recommended. Reference: JACR 2020 Feb;17(2):248-254   Other: Trace pelvic free fluid which may be physiologic or related to appendiceal inflammation. No walled off fluid collections. No pneumoperitoneum.   Musculoskeletal: No acute or significant osseous findings.   IMPRESSION: Acute appendicitis without evidence of perforation, no drainable fluid collection.     Electronically Signed   By: Maudry Mayhew M.D.   On: 09/14/2021 18:04   Assessment:      Acute appendicitis  Plan:      Discussed the risk of surgery including post-op infxn, seroma, hematoma, abscess formation, chronic pain, poor-delayed wound healing, possible bowel resection, possible ostomy, possible conversion to open procedure, post-op SBO or ileus, and need for additional procedures to address said risks.  The risks of general anesthetic including MI, CVA, sudden death or even reaction to anesthetic medications also discussed. Alternatives include continued observation, or antibiotic treatment.  Benefits include possible symptom relief,   Typical post operative recovery of 3-5 days rest, also discussed.  The patient understands the risks, any and all questions were answered to the patient's satisfaction.  To OR.  Abx and pain control in the meantime.  Entire encounter done through interpreter

## 2021-09-14 NOTE — Op Note (Signed)
Preoperative diagnosis: acute appendicitis  Postoperative diagnosis: Same  Procedure: Robotic assisted laparoscopic appendectomy.  Anesthesia: GETA  Surgeon: Sung Amabile  Wound Classification: clean contaminated  Specimen: Appendix  Complications: None  Estimated Blood Loss: 3 mL   Indications: Patient is a 20 y.o. female  presented with above.  Please see H&P for further details.    FIndings: 1.  Irritated appendix  2. No peri-appendiceal abscess or phlegmon 3. Normal anatomy 4. Appendiceal artery ligated and divided with stapler 5. Adequate hemostasis.   Description of procedure: The patient was placed on the operating table in the supine position, left arm tucked. General anesthesia was induced. A time-out was completed verifying correct patient, procedure, site, positioning, and implant(s) and/or special equipment prior to beginning this procedure. The abdomen was prepped and draped in the usual sterile fashion.   Palmer's point located and Veress needle was inserted.  After confirming 2 clicks and a positive saline drop test, gas insufflation was initiated until the abdominal pressure was measured at 15 mmHg.  Afterwards, the Veress needle was removed and a 8 mm port was placed through a periumbilical site using Optiview technique after incision with an 11 blade.  After local was infused, 2 additional incision was made 8 cm apart each side along the left side of the abdominal wall from the initial incision.  An 8 mm port was caudaed and 61mm port cephalad from initial incision, both under direct visualization.  No injuries from trocar placements were noted. The table was placed in the Trendelenburg position with the right side elevated.  Xi robotic platform was then brought to the operative field and docked.  An inflamed appendix was identified and elevated.  Infection was present within the abdominal cavity due to appendicitis. Window created at base of appendix in the  mesentery.   A blue load linear cutting stapler was then used to divide and staple the base of the appendix. It was reloaded with a vascular cartridge and the mesoappendix similarly divided.  No bleeding from the staple lines noted.  The appendix was placed in an endoscopic retrieval bag and removed.   The appendiceal stump and mesoappendix staple line examined again and hemostasis noted. No other pathology was identified within pelvis. The 12 mm trocar removed and port site closed with PMI using 0 vicryl under direct vision. Remaining trocars were removed under direct vision. No bleeding was noted.The abdomen was allowed to collapse. 3-0 vicryl interrupted used to close 22mm port site at dermal level, and then all skin incisions then closed with subcuticular sutures Monocryl 4-0.  Wounds then dressed with dermabond.  The patient tolerated the procedure well, awakened from anesthesia and was taken to the postanesthesia care unit in satisfactory condition.  Sponge count and instrument count correct at the end of the procedure.

## 2021-09-14 NOTE — ED Triage Notes (Signed)
Pt via POV from home. Pt c/o abd pain that started 0200 this AM. Pt endorse nausea. Pt is also endorse rectal bleeding. Blood is dark. Denies vaginal bleeding. Denies any abd surgeries. Pt is A&OX4 and NAD.

## 2021-09-14 NOTE — ED Notes (Signed)
Patient was taken to OR with clothing bag, purse and cell phone with her.

## 2021-09-14 NOTE — ED Notes (Signed)
Video interpreter used to inform patient of impending transport to OR.

## 2021-09-14 NOTE — ED Provider Notes (Signed)
[facility] Emergency Department Provider Note  ____________________________________________   Event Date/Time   First MD Initiated Contact with Patient 09/14/21 1600     (approximate)  I have reviewed the triage vital signs and the nursing notes.   HISTORY  Chief Complaint Abdominal Pain    HPI Nicole Rocha is a 20 y.o. female otherwise healthy comes in with abd pain at 200 AM.  Patient reports sudden onset right lower quadrant pain that started at 2 AM, constant, nothing makes it better or worse.  She reports having some chills associated with it as well as an episode of bright red blood per rectum.  She reports being sexually active with 1 partner for a year and a half she denies concerns for STDs or any vaginal discharge.  She denies any history of ovarian issues.            Past Medical History:  Diagnosis Date   Abnormal Pap smear of cervix 11/26/20 LSIL 03/11/2021    Patient Active Problem List   Diagnosis Date Noted   [redacted] weeks gestation of pregnancy 06/25/2021   Encounter for planned induction of labor 06/22/2021   Abnormal glucose affecting pregnancy - Equivocal 3 hGTT 03/27/2021   Abnormal Pap smear of cervix 11/26/20 LSIL 03/11/2021   Anemia affecting pregnancy 03/11/2021   Supervision of other normal pregnancy, antepartum 11/26/2020    Past Surgical History:  Procedure Laterality Date   NO PAST SURGERIES      Prior to Admission medications   Medication Sig Start Date End Date Taking? Authorizing Provider  acetaminophen (TYLENOL) 500 MG tablet Take 2 tablets (1,000 mg total) by mouth every 6 (six) hours as needed for mild pain. Patient not taking: Reported on 07/03/2021 06/25/21   Will Bonnet, MD  ibuprofen (ADVIL) 600 MG tablet Take 1 tablet (600 mg total) by mouth every 6 (six) hours as needed for mild pain, cramping or moderate pain. Patient not taking: Reported on 07/03/2021 06/25/21   Will Bonnet, MD   Prenatal Vit-Fe Fumarate-FA (PRENATAL VITAMIN) 27-0.8 MG TABS Take 1 tablet by mouth daily at 6 (six) AM. Patient not taking: Reported on 07/03/2021 02/11/21   Caren Macadam, MD  sulfamethoxazole-trimethoprim (BACTRIM DS) 800-160 MG tablet Take 1 tablet by mouth 2 (two) times daily. 08/11/21   Cuthriell, Charline Bills, PA-C    Allergies Patient has no known allergies.  Family History  Problem Relation Age of Onset   Healthy Mother    Healthy Father    Healthy Brother    Healthy Brother     Social History Social History   Tobacco Use   Smoking status: Never    Passive exposure: Never   Smokeless tobacco: Never   Tobacco comments:    Denies secondhand smoke  Vaping Use   Vaping Use: Never used  Substance Use Topics   Alcohol use: Not Currently    Comment: occasionally before pregnancy   Drug use: Not Currently      Review of Systems Constitutional: + chills Eyes: No visual changes. ENT: No sore throat. Cardiovascular: Denies chest pain. Respiratory: Denies shortness of breath. Gastrointestinal: +abd pain  No nausea, no vomiting.  No diarrhea.  No constipation. Genitourinary: Negative for dysuria. Musculoskeletal: Negative for back pain. Skin: Negative for rash. Neurological: Negative for headaches, focal weakness or numbness. All other ROS negative ____________________________________________   PHYSICAL EXAM:  VITAL SIGNS: ED Triage Vitals  Enc Vitals Group     BP 09/14/21 1322 118/78  Pulse Rate 09/14/21 1322 (!) 145     Resp 09/14/21 1322 20     Temp 09/14/21 1322 99.4 F (37.4 C)     Temp Source 09/14/21 1322 Oral     SpO2 09/14/21 1322 100 %     Weight 09/14/21 1329 184 lb (83.5 kg)     Height 09/14/21 1329 5\' 7"  (1.702 m)     Head Circumference --      Peak Flow --      Pain Score 09/14/21 1327 10     Pain Loc --      Pain Edu? --      Excl. in GC? --     Constitutional: Alert and oriented. Well appearing and in no acute  distress. Eyes: Conjunctivae are normal. EOMI. Head: Atraumatic. Nose: No congestion/rhinnorhea. Mouth/Throat: Mucous membranes are moist.   Neck: No stridor. Trachea Midline. FROM Cardiovascular: Normal rate, regular rhythm. Grossly normal heart sounds.  Good peripheral circulation. Respiratory: Normal respiratory effort.  No retractions. Lungs CTAB. Gastrointestinal: RLQ pain.. No distention. No abdominal bruits.  Musculoskeletal: No lower extremity tenderness nor edema.  No joint effusions. Neurologic:  Normal speech and language. No gross focal neurologic deficits are appreciated.  Skin:  Skin is warm, dry and intact. No rash noted. Psychiatric: Mood and affect are normal. Speech and behavior are normal. GU: Deferred   ____________________________________________   LABS (all labs ordered are listed, but only abnormal results are displayed)  Labs Reviewed  COMPREHENSIVE METABOLIC PANEL - Abnormal; Notable for the following components:      Result Value   Sodium 133 (*)    Glucose, Bld 113 (*)    All other components within normal limits  CBC - Abnormal; Notable for the following components:   WBC 16.3 (*)    All other components within normal limits  LIPASE, BLOOD  URINALYSIS, ROUTINE W REFLEX MICROSCOPIC  POC URINE PREG, ED   ____________________________________________   ED ECG REPORT I, 14/05/22, the attending physician, personally viewed and interpreted this ECG.  Sinus tachycardia rate of 133, no ST elevation, T wave version in lead III, V3, right bundle branch block.  Reviewed prior EKG and has right bundle branch block previously ____________________________________________  RADIOLOGY   Official radiology report(s): CT ABDOMEN PELVIS W CONTRAST  Result Date: 09/14/2021 CLINICAL DATA:  Abdominal pain since this a.m. EXAM: CT ABDOMEN AND PELVIS WITH CONTRAST TECHNIQUE: Multidetector CT imaging of the abdomen and pelvis was performed using the standard  protocol following bolus administration of intravenous contrast. CONTRAST:  14/02/2021 OMNIPAQUE IOHEXOL 300 MG/ML  SOLN COMPARISON:  None. FINDINGS: Lower chest: No acute abnormality. Hepatobiliary: No suspicious hepatic lesion. Gallbladder is unremarkable. No biliary ductal dilation. Pancreas: No pancreatic ductal dilation or evidence of acute inflammation. Spleen: Within normal limits. Adrenals/Urinary Tract: Bilateral adrenal glands are unremarkable. No hydronephrosis. No solid enhancing renal mass. Kidneys demonstrate symmetric enhancement. Mild wall thickening of an incompletely distended urinary bladder. Stomach/Bowel: No enteric contrast was administered. Stomach is decompressed limiting evaluation. No pathologic dilation of small or large bowel. Terminal ileum appears normal. The appendix is dilated and inflamed measuring up to 12 mm. Vascular/Lymphatic: No significant vascular findings are present. No enlarged abdominal or pelvic lymph nodes. Reproductive: Uterus is unremarkable. 2.5 cm right ovarian cyst and a 2.2 cm left ovarian cyst. No follow-up imaging is recommended. Reference: JACR 2020 Feb;17(2):248-254 Other: Trace pelvic free fluid which may be physiologic or related to appendiceal inflammation. No walled off fluid collections. No pneumoperitoneum.  Musculoskeletal: No acute or significant osseous findings. IMPRESSION: Acute appendicitis without evidence of perforation, no drainable fluid collection. Electronically Signed   By: Dahlia Bailiff M.D.   On: 09/14/2021 18:04    ____________________________________________   PROCEDURES  Procedure(s) performed (including Critical Care):  .Critical Care Performed by: Ciera La Grulla, MD Authorized by: Kenzi Kalama, MD   Critical care provider statement:    Critical care time (minutes):  30   Critical care was necessary to treat or prevent imminent or life-threatening deterioration of the following conditions:  Sepsis   Critical care was time  spent personally by me on the following activities:  Development of treatment plan with patient or surrogate, discussions with consultants, evaluation of patient's response to treatment, examination of patient, ordering and review of laboratory studies, ordering and review of radiographic studies, ordering and performing treatments and interventions, pulse oximetry, re-evaluation of patient's condition and review of old charts   ____________________________________________   INITIAL IMPRESSION / ASSESSMENT AND PLAN / ED COURSE  Seema Cortes-Pedroza was evaluated in Emergency Department on 09/14/2021 for the symptoms described in the history of present illness. She was evaluated in the context of the global COVID-19 pandemic, which necessitated consideration that the patient might be at risk for infection with the SARS-CoV-2 virus that causes COVID-19. Institutional protocols and algorithms that pertain to the evaluation of patients at risk for COVID-19 are in a state of rapid change based on information released by regulatory bodies including the CDC and federal and state organizations. These policies and algorithms were followed during the patient's care in the ED.    Patient comes in with right lower quadrant tenderness.  Patient pretty tender on exam.  Pregnancy test negative so unlikely ectopic.  Given the white count low-grade temperatures and tachycardia and concern for sepsis, infection.  I am mostly concerned about appendicitis so therefore we will proceed with CT imaging.  This is to rule out appendicitis but also to make sure no evidence of torsion, cyst or other acute pathology.  She report a little bit of blood per rectum but her hemoglobin is stable.  Could be from some colitis.  Urine has a lot of squamous cells so not a great sample but she denied any urinary symptoms, COVID, flu are negative.  CT scan consistent with appendicitis.  Patient meets sepsis criteria with elevated heart rate  and elevated white count.  Blood cultures and lactate were drawn.  Lactate was normal and blood pressures have been stable.  Case was discussed with surgeon Dr. Lysle Pearl.  Patient will be going to OR for appendectomy.  Placed Zosyn on patient       ____________________________________________   FINAL CLINICAL IMPRESSION(S) / ED DIAGNOSES   Final diagnoses:  Acute appendicitis, unspecified acute appendicitis type  Sepsis, due to unspecified organism, unspecified whether acute organ dysfunction present [facility])      MEDICATIONS GIVEN DURING THIS VISIT:  Medications  piperacillin-tazobactam (ZOSYN) IVPB 3.375 g (has no administration in time range)  sodium chloride 0.9 % bolus 1,000 mL (0 mLs Intravenous Stopped 09/14/21 1829)  iohexol (OMNIPAQUE) 300 MG/ML solution 100 mL (100 mLs Intravenous Contrast Given 09/14/21 1727)  fentaNYL (SUBLIMAZE) injection 50 mcg (50 mcg Intravenous Given 09/14/21 1751)  ondansetron (ZOFRAN) injection 4 mg (4 mg Intravenous Given 09/14/21 1749)  magnesium sulfate IVPB 2 g 50 mL (0 g Intravenous Stopped 09/14/21 1859)     ED Discharge Orders     None  Note:  This document was prepared using Dragon voice recognition software and may include unintentional dictation errors.    Kimiko Sheffield, MD 09/14/21 Drema Halon

## 2021-09-14 NOTE — Transfer of Care (Signed)
Immediate Anesthesia Transfer of Care Note  Patient: Nicole Rocha  Procedure(s) Performed: XI ROBOTIC LAPAROSCOPIC ASSISTED APPENDECTOMY (Abdomen)  Patient Location: PACU  Anesthesia Type:General  Level of Consciousness: drowsy  Airway & Oxygen Therapy: Patient Spontanous Breathing and Patient connected to face mask oxygen  Post-op Assessment: Report given to RN  Post vital signs: stable  Last Vitals:  Vitals Value Taken Time  BP    Temp    Pulse 113 09/14/21 2204  Resp 33 09/14/21 2204  SpO2 95 % 09/14/21 2204  Vitals shown include unvalidated device data.  Last Pain:  Vitals:   09/14/21 2008  TempSrc:   PainSc: Asleep         Complications: No notable events documented.

## 2021-09-14 NOTE — Anesthesia Procedure Notes (Signed)
Procedure Name: Intubation Date/Time: 09/14/2021 8:51 PM Performed by: Jaye Beagle, CRNA Pre-anesthesia Checklist: Patient identified, Emergency Drugs available, Suction available and Patient being monitored Patient Re-evaluated:Patient Re-evaluated prior to induction Oxygen Delivery Method: Circle system utilized Preoxygenation: Pre-oxygenation with 100% oxygen Induction Type: IV induction Ventilation: Mask ventilation without difficulty Laryngoscope Size: McGraph and 3 Grade View: Grade II Tube type: Oral Tube size: 7.0 mm Number of attempts: 1 Airway Equipment and Method: Stylet and Oral airway Placement Confirmation: ETT inserted through vocal cords under direct vision, positive ETCO2 and breath sounds checked- equal and bilateral Secured at: 22 cm Tube secured with: Tape Dental Injury: Teeth and Oropharynx as per pre-operative assessment

## 2021-09-14 NOTE — ED Notes (Signed)
Please call Madaline Guthrie at [phone] after surgery.

## 2021-09-14 NOTE — OR Nursing (Signed)
Dr. Bonney Aid notified by Dr. Tonna Boehringer for consultation

## 2021-09-15 ENCOUNTER — Encounter: Payer: Self-pay | Admitting: Surgery

## 2021-09-15 LAB — CREATININE, SERUM
Creatinine, Ser: 0.58 mg/dL (ref 0.44–1.00)
GFR, Estimated: >60 mL/min (ref >60)

## 2021-09-15 LAB — BASIC METABOLIC PANEL
Anion gap: 5 (ref 5–15)
BUN: 8 mg/dL (ref 6–20)
CO2: 24 mmol/L (ref 22–32)
Calcium: 8.6 mg/dL — ABNORMAL LOW (ref 8.9–10.3)
Chloride: 105 mmol/L (ref 98–111)
Creatinine, Ser: 0.55 mg/dL (ref 0.44–1.00)
GFR, Estimated: >60 mL/min (ref >60)
Glucose, Bld: 158 mg/dL — ABNORMAL HIGH (ref 70–99)
Potassium: 3.9 mmol/L (ref 3.5–5.1)
Sodium: 134 mmol/L — ABNORMAL LOW (ref 135–145)

## 2021-09-15 LAB — CBC
HCT: 32.5 % — ABNORMAL LOW (ref 36.0–46.0)
HCT: 33.2 % — ABNORMAL LOW (ref 36.0–46.0)
HCT: 34.1 % — ABNORMAL LOW (ref 36.0–46.0)
Hemoglobin: 11 g/dL — ABNORMAL LOW (ref 12.0–15.0)
Hemoglobin: 11.4 g/dL — ABNORMAL LOW (ref 12.0–15.0)
Hemoglobin: 11.5 g/dL — ABNORMAL LOW (ref 12.0–15.0)
MCH: 27.6 pg (ref 26.0–34.0)
MCH: 27.6 pg (ref 26.0–34.0)
MCH: 28 pg (ref 26.0–34.0)
MCHC: 33.7 g/dL (ref 30.0–36.0)
MCHC: 33.8 g/dL (ref 30.0–36.0)
MCHC: 34.3 g/dL (ref 30.0–36.0)
MCV: 81.6 fL (ref 80.0–100.0)
MCV: 81.7 fL (ref 80.0–100.0)
MCV: 82 fL (ref 80.0–100.0)
Platelets: 282 10*3/uL (ref 150–400)
Platelets: 285 10*3/uL (ref 150–400)
Platelets: 291 10*3/uL (ref 150–400)
RBC: 3.98 MIL/uL (ref 3.87–5.11)
RBC: 4.07 MIL/uL (ref 3.87–5.11)
RBC: 4.16 MIL/uL (ref 3.87–5.11)
RDW: 15.2 % (ref 11.5–15.5)
RDW: 15.6 % — ABNORMAL HIGH (ref 11.5–15.5)
RDW: 15.6 % — ABNORMAL HIGH (ref 11.5–15.5)
WBC: 13.2 10*3/uL — ABNORMAL HIGH (ref 4.0–10.5)
WBC: 13.7 10*3/uL — ABNORMAL HIGH (ref 4.0–10.5)
WBC: 14.9 10*3/uL — ABNORMAL HIGH (ref 4.0–10.5)
nRBC: 0 % (ref 0.0–0.2)
nRBC: 0 % (ref 0.0–0.2)
nRBC: 0 % (ref 0.0–0.2)

## 2021-09-15 LAB — HIV ANTIBODY (ROUTINE TESTING W REFLEX): HIV Screen 4th Generation wRfx: NONREACTIVE

## 2021-09-15 LAB — MAGNESIUM: Magnesium: 2 mg/dL (ref 1.7–2.4)

## 2021-09-15 LAB — PHOSPHORUS: Phosphorus: 2.9 mg/dL (ref 2.5–4.6)

## 2021-09-15 MED ORDER — DOCUSATE SODIUM 100 MG PO CAPS
100.0000 mg | ORAL_CAPSULE | Freq: Every day | ORAL | Status: DC
  Administered 2021-09-15: 100 mg via ORAL
  Filled 2021-09-15: qty 1

## 2021-09-15 MED ORDER — OXYCODONE HCL 5 MG PO TABS: 5.0000 mg | ORAL_TABLET | Freq: Three times a day (TID) | ORAL | 0 refills | Status: DC | PRN

## 2021-09-15 MED ORDER — ACETAMINOPHEN 325 MG PO TABS
650.0000 mg | ORAL_TABLET | Freq: Four times a day (QID) | ORAL | Status: DC
  Administered 2021-09-15 – 2021-09-16 (×3): 650 mg via ORAL
  Filled 2021-09-15 (×3): qty 2

## 2021-09-15 MED ORDER — LACTATED RINGERS IV SOLN: INTRAVENOUS | Status: DC

## 2021-09-15 MED ORDER — TRAMADOL HCL 50 MG PO TABS
50.0000 mg | ORAL_TABLET | Freq: Four times a day (QID) | ORAL | Status: DC | PRN
  Administered 2021-09-15 – 2021-09-16 (×2): 50 mg via ORAL
  Filled 2021-09-15 (×2): qty 1

## 2021-09-15 MED ORDER — HYDROMORPHONE HCL 1 MG/ML IJ SOLN
0.5000 mg | INTRAMUSCULAR | Status: DC | PRN
  Administered 2021-09-15 (×2): 0.5 mg via INTRAVENOUS
  Filled 2021-09-15 (×2): qty 1

## 2021-09-15 MED ORDER — IBUPROFEN 800 MG PO TABS: 800.0000 mg | ORAL_TABLET | Freq: Three times a day (TID) | ORAL | 0 refills | Status: DC | PRN

## 2021-09-15 MED ORDER — SIMETHICONE 80 MG PO CHEW
80.0000 mg | CHEWABLE_TABLET | Freq: Once | ORAL | Status: AC
Start: 2021-09-15 — End: 2021-09-15
  Administered 2021-09-15: 80 mg via ORAL
  Filled 2021-09-15: qty 1

## 2021-09-15 MED ORDER — DOCUSATE SODIUM 100 MG PO CAPS: 100.0000 mg | ORAL_CAPSULE | Freq: Two times a day (BID) | ORAL | 0 refills | Status: AC | PRN

## 2021-09-15 MED ORDER — ACETAMINOPHEN 325 MG PO TABS: 650.0000 mg | ORAL_TABLET | Freq: Three times a day (TID) | ORAL | 0 refills | Status: AC | PRN

## 2021-09-15 NOTE — Discharge Instructions (Signed)
Laparoscopic Appendectomy, Care After This sheet gives you information about how to care for yourself after your procedure. Your doctor may also give you more specific instructions. If you have problems or questions, contact your doctor. Follow these instructions at home: Care for cuts from surgery (incisions)  Follow instructions from your doctor about how to take care of your cuts from surgery. Make sure you: Wash your hands with soap and water before you change your bandage (dressing). If you cannot use soap and water, use hand sanitizer. Change your bandage as told by your doctor. Leave stitches (sutures), skin glue, or skin tape (adhesive) strips in place. They may need to stay in place for 2 weeks or longer. If tape strips get loose and curl up, you may trim the loose edges. Do not remove tape strips completely unless your doctor says it is okay. Do not take baths, swim, or use a hot tub until your doctor says it is okay. OK TO SHOWER 24HRS AFTER YOUR SURGERY.  Check your surgical cut area every day for signs of infection. Check for: More redness, swelling, or pain. More fluid or blood. Warmth. Pus or a bad smell. Activity Do not drive or use heavy machinery while taking prescription pain medicine. Do not play contact sports until your doctor says it is okay. Do not drive for 24 hours if you were given a medicine to help you relax (sedative). Rest as needed. Do not return to work or school until your doctor says it is okay. General instructions  tylenol and advil as needed for discomfort.  Please alternate between the two every four hours as needed for pain.    Use narcotics, if prescribed, only when tylenol and motrin is not enough to control pain.  325-650mg  every 8hrs to max of 3000mg /24hrs for the tylenol.    Advil up to 800mg  per dose every 8hrs as needed for pain.   To prevent or treat constipation while you are taking prescription pain medicine, your doctor may recommend that  you: Drink enough fluid to keep your pee (urine) clear or pale yellow. Take over-the-counter or prescription medicines. Eat foods that are high in fiber, such as fresh fruits and vegetables, whole grains, and beans. Limit foods that are high in fat and processed sugars, such as fried and sweet foods. Contact a doctor if: You develop a rash. You have more redness, swelling, or pain around your surgical cuts. You have more fluid or blood coming from your surgical cuts. Your surgical cuts feel warm to the touch. You have pus or a bad smell coming from your surgical cuts. You have a fever. One or more of your surgical cuts breaks open. Get help right away if: You have trouble breathing. You have chest pain. You faint or feel dizzy when you stand. You have leg pain. This information is not intended to replace advice given to you by your health care provider. Make sure you discuss any questions you have with your health care provider. Document Released: 07/06/2008 Document Revised: 04/17/2016 Document Reviewed: 03/15/2016 Elsevier Interactive Patient Education  2019 06/18/2016.

## 2021-09-15 NOTE — Anesthesia Postprocedure Evaluation (Signed)
Anesthesia Post Note  Patient: Nicole Rocha  Procedure(s) Performed: XI ROBOTIC LAPAROSCOPIC ASSISTED APPENDECTOMY (Abdomen)  Patient location during evaluation: PACU Anesthesia Type: General Level of consciousness: awake and alert Pain management: pain level controlled Vital Signs Assessment: post-procedure vital signs reviewed and stable Respiratory status: spontaneous breathing, nonlabored ventilation and respiratory function stable Cardiovascular status: blood pressure returned to baseline and stable Postop Assessment: no apparent nausea or vomiting Anesthetic complications: no   No notable events documented.   Last Vitals:  Vitals:   09/14/21 2346 09/15/21 0419  BP: 98/62 93/65  Pulse: 99 86  Resp: 18 18  Temp: 36.9 C 36.6 C  SpO2: 96% 95%    Last Pain:  Vitals:   09/15/21 0524  TempSrc:   PainSc: Asleep                 Foye Deer

## 2021-09-16 LAB — CBC
HCT: 31.3 % — ABNORMAL LOW (ref 36.0–46.0)
Hemoglobin: 10.4 g/dL — ABNORMAL LOW (ref 12.0–15.0)
MCH: 27.4 pg (ref 26.0–34.0)
MCHC: 33.2 g/dL (ref 30.0–36.0)
MCV: 82.4 fL (ref 80.0–100.0)
Platelets: 277 10*3/uL (ref 150–400)
RBC: 3.8 MIL/uL — ABNORMAL LOW (ref 3.87–5.11)
RDW: 15.5 % (ref 11.5–15.5)
WBC: 11.9 10*3/uL — ABNORMAL HIGH (ref 4.0–10.5)
nRBC: 0 % (ref 0.0–0.2)

## 2021-09-16 LAB — BASIC METABOLIC PANEL
Anion gap: 5 (ref 5–15)
BUN: 12 mg/dL (ref 6–20)
CO2: 27 mmol/L (ref 22–32)
Calcium: 8.3 mg/dL — ABNORMAL LOW (ref 8.9–10.3)
Chloride: 106 mmol/L (ref 98–111)
Creatinine, Ser: 0.52 mg/dL (ref 0.44–1.00)
GFR, Estimated: >60 mL/min (ref >60)
Glucose, Bld: 109 mg/dL — ABNORMAL HIGH (ref 70–99)
Potassium: 3.4 mmol/L — ABNORMAL LOW (ref 3.5–5.1)
Sodium: 138 mmol/L (ref 135–145)

## 2021-09-16 LAB — MAGNESIUM: Magnesium: 2 mg/dL (ref 1.7–2.4)

## 2021-09-16 LAB — PHOSPHORUS: Phosphorus: 4.8 mg/dL — ABNORMAL HIGH (ref 2.5–4.6)

## 2021-09-16 LAB — SURGICAL PATHOLOGY

## 2021-09-16 MED ORDER — POTASSIUM CHLORIDE CRYS ER 20 MEQ PO TBCR
40.0000 meq | EXTENDED_RELEASE_TABLET | Freq: Once | ORAL | Status: AC
  Administered 2021-09-16: 40 meq via ORAL
  Filled 2021-09-16: qty 2

## 2021-09-16 NOTE — Discharge Summary (Signed)
Physician Discharge Summary  Patient ID: Nicole Rocha MRN: [id number] DOB/AGE: 2001/08/23 20 y.o.  Admit date: 09/14/2021 Discharge date: 09/16/21  Admission Diagnoses: acute appendicitis  Discharge Diagnoses:  Same as above  Discharged Condition: good  Hospital Course: admitted for above. Underwent surgery.  Please see op note for details.  Post op, some pain issues controlled with extra day of obs.  At time of d/c, tolerating diet and pain controlled  Consults: None  Discharge Exam: Blood pressure 102/65, pulse 71, temperature 97.8 F (36.6 C), temperature source Oral, resp. rate 18, height 5\' 7"  (1.702 m), weight 83.5 kg, SpO2 99 %, not currently breastfeeding. General appearance: alert, cooperative, and no distress GI: soft, no guarding, TTP along RLQ and incisions  Disposition:  Discharge disposition: 01-Home or Self Care       Discharge Instructions     Discharge patient   Complete by: As directed    Discharge disposition: 01-Home or Self Care   Discharge patient date: 09/15/2021   Discharge patient   Complete by: As directed    Discharge disposition: 01-Home or Self Care   Discharge patient date: 09/16/2021      Allergies as of 09/16/2021   No Known Allergies      Medication List     STOP taking these medications    Prenatal Vitamin 27-0.8 MG Tabs   sulfamethoxazole-trimethoprim 800-160 MG tablet Commonly known as: BACTRIM DS       TAKE these medications    acetaminophen 325 MG tablet Commonly known as: Tylenol Take 2 tablets (650 mg total) by mouth every 8 (eight) hours as needed for mild pain. What changed:  medication strength how much to take when to take this   docusate sodium 100 MG capsule Commonly known as: Colace Take 1 capsule (100 mg total) by mouth 2 (two) times daily as needed for up to 10 days for mild constipation.   ibuprofen 800 MG tablet Commonly known as: ADVIL Take 1 tablet (800 mg total) by mouth every 8  (eight) hours as needed for mild pain or moderate pain. What changed:  medication strength how much to take when to take this reasons to take this   oxyCODONE 5 MG immediate release tablet Commonly known as: Roxicodone Take 1 tablet (5 mg total) by mouth every 8 (eight) hours as needed for up to 10 doses.        Follow-up Information     [place], Dallys Nowakowski, DO. Go on 09/30/2021.   Specialty: Surgery Why: Follow-up post op lap appy: Wednesday, 12/21 at 9:45am Contact information: [address] [place] [phone]                  Total time spent arranging discharge was >51min. Signed: 31m 09/16/2021, 12:41 PM

## 2021-09-16 NOTE — Progress Notes (Signed)
Patient discharged home. Discharge instructions and prescriptions given and reviewed with patient. Patient verbalized understanding.   Follow-up appointment scheduled with Dr. Tonna Boehringer Wednesday, 12/21 at 9:45am.   Will be escorted out by volunteers.

## 2021-09-19 LAB — CULTURE, BLOOD (ROUTINE X 2)
Culture: NO GROWTH
Culture: NO GROWTH
Special Requests: ADEQUATE

## 2021-09-21 ENCOUNTER — Other Ambulatory Visit: Payer: Self-pay

## 2021-09-21 ENCOUNTER — Emergency Department
Admission: EM | Admit: 2021-09-21 | Discharge: 2021-09-21 | Disposition: A | Discharge status: Home / Self Care | Payer: Medicaid Other | Attending: Emergency Medicine | Admitting: Emergency Medicine

## 2021-09-21 DIAGNOSIS — G9782 Other postprocedural complications and disorders of nervous system: Secondary | ICD-10-CM | POA: Insufficient documentation

## 2021-09-21 DIAGNOSIS — K922 Gastrointestinal hemorrhage, unspecified: Secondary | ICD-10-CM | POA: Diagnosis not present

## 2021-09-21 DIAGNOSIS — K625 Hemorrhage of anus and rectum: Secondary | ICD-10-CM | POA: Insufficient documentation

## 2021-09-21 LAB — CBC
HCT: 35.4 % — ABNORMAL LOW (ref 36.0–46.0)
Hemoglobin: 11.8 g/dL — ABNORMAL LOW (ref 12.0–15.0)
MCH: 27.5 pg (ref 26.0–34.0)
MCHC: 33.3 g/dL (ref 30.0–36.0)
MCV: 82.5 fL (ref 80.0–100.0)
Platelets: 369 10*3/uL (ref 150–400)
RBC: 4.29 MIL/uL (ref 3.87–5.11)
RDW: 15.2 % (ref 11.5–15.5)
WBC: 8.4 10*3/uL (ref 4.0–10.5)
nRBC: 0 % (ref 0.0–0.2)

## 2021-09-21 LAB — COMPREHENSIVE METABOLIC PANEL
ALT: 28 U/L (ref 0–44)
AST: 23 U/L (ref 15–41)
Albumin: 4.2 g/dL (ref 3.5–5.0)
Alkaline Phosphatase: 73 U/L (ref 38–126)
Anion gap: 7 (ref 5–15)
BUN: 20 mg/dL (ref 6–20)
CO2: 26 mmol/L (ref 22–32)
Calcium: 9.1 mg/dL (ref 8.9–10.3)
Chloride: 103 mmol/L (ref 98–111)
Creatinine, Ser: 0.48 mg/dL (ref 0.44–1.00)
GFR, Estimated: >60 mL/min (ref >60)
Glucose, Bld: 106 mg/dL — ABNORMAL HIGH (ref 70–99)
Potassium: 4.1 mmol/L (ref 3.5–5.1)
Sodium: 136 mmol/L (ref 135–145)
Total Bilirubin: 0.6 mg/dL (ref 0.3–1.2)
Total Protein: 8.3 g/dL — ABNORMAL HIGH (ref 6.5–8.1)

## 2021-09-21 LAB — TYPE AND SCREEN
ABO/RH(D): A POS
Antibody Screen: NEGATIVE

## 2021-09-21 MED ORDER — POLYETHYLENE GLYCOL 3350 17 G PO PACK
17.0000 g | PACK | Freq: Two times a day (BID) | ORAL | 0 refills | Status: DC
Start: 2021-09-21 — End: 2022-09-08

## 2021-09-21 MED ORDER — DIPHENHYDRAMINE-ZINC ACETATE 2-0.1 % EX CREA
TOPICAL_CREAM | Freq: Three times a day (TID) | CUTANEOUS | Status: DC | PRN
  Filled 2021-09-21: qty 28

## 2021-09-21 MED ORDER — LIDOCAINE VISCOUS HCL 2 % MT SOLN: 15.0000 mL | OROMUCOSAL | 1 refills | Status: DC | PRN

## 2021-09-21 MED ORDER — DOCUSATE SODIUM 100 MG PO CAPS: 100.0000 mg | ORAL_CAPSULE | Freq: Two times a day (BID) | ORAL | 2 refills | Status: DC

## 2021-09-21 NOTE — ED Triage Notes (Signed)
Pt states she had appendectomy 5 days ago, pt c/o blood in stools for the past month but feels like the bleeding is worse, passing clots

## 2021-09-21 NOTE — ED Notes (Signed)
EDP at bedside examining pt. Pt states has had rectal bleeding for about 1 month but bleeding increased yesterday and noticed some clots in toilet. Has rectal pain.   Pt had appendectomy about 1 month ago.

## 2021-09-21 NOTE — Discharge Instructions (Addendum)
Please return if you get very weak or lightheaded or having a lot of blood come out.  Otherwise please follow-up with Dr. Timothy Lasso.  Give his office a call and arrange the appointment as soon as you can.  Your blood work and CT scan from a couple days ago all look okay.  Dr. Timothy Lasso can evaluate you further in the office.  I have included his contact information above.  You can give his office a call in a week or 2 if you are still having problems.  If your blurry vision returns do not hesitate to return here or you can see [facility] or another eye doctor.  When he saw you Dr. Timothy Lasso thought you may have a rectal fissure.  We will treat that using lidocaine jelly.  With some on your finger and apply it to the rectum when it is painful especially before and after you stool.  Use the Colace twice a day to help soften your stool.  Use the MiraLAX twice a day also to soften the stool.  Take sitz bath's 2 or 3 times a day.  Run warm water in the bathtub and sit in it for 10 to 20 minutes.  Be careful to dry off and clean yourself off well afterwards.  You can use Benadryl cream which you can get over-the-counter to the skin 3-4 times a day.  This should take care of the rash after a few days.  If you have any further problems with the rash or anything else gets worse please return.  Please follow-up with [facility].  You can try the [place] [facility] or the [facility] health care or the [facility] or the [facility] or the [facility] clinic or [facility] charity care.  [facility] will see you if you are a resident of the state without any difficulty.  If you have insurance you can follow-up with the [facility] or [facility] or [facility] or [facility] or [facility].

## 2021-09-21 NOTE — ED Notes (Signed)
EDP performed bedside occult blood test which was positive.

## 2021-09-21 NOTE — Consult Note (Signed)
Inpatient Consultation   Patient ID: Nicole Rocha is a 20 y.o. female.  Requesting Provider: Dr. Darnelle Catalan - Emergency Medicine  Date of Admission: 09/21/2021  Date of Consult: 09/21/21   Reason for Consultation: hematochezia   Patient's Chief Complaint:   Chief Complaint  Patient presents with   Post-op Problem   GI Bleeding    20 year old Hispanic female with recent appendicitis status post appendectomy on 09/14/21 who presents with rectal pain and hematochezia.  Medical Spanish interpretation is provided by nurse Barbee Cough at bedside.  For 1 month duration the patient has noted pain with defecation and overnight she developed blood associated with her stool in the bowl and streaked around the stool.  Upon pictures she shows [place] the stool appears to be brown with water tinged red with blood.  She notes prior to this months she has been battling with constipation and straining.  She denies any other abdominal pain fever or chills.  Denies any melena.  A digital rectal exam was performed by the emergency medicine provider and the patient noted intense pain with this.  Some blood was noted upon DRE as well.  She has not noted any alleviating factors, but has not tried any interventions.  Does note irregular menses with vaginal clots in the past as well.  She denies that this is what is actively going on at this time.  Notes skin itching and rash over her abdomen.  Surgical sites are clean dry and intact.  Ibuprofen and tylenol at home Denies Anti-plt agents, and anticoagulants Denies family history of gastrointestinal disease and malignancy Previous Endoscopies: None   Past Medical History:  Diagnosis Date   Abnormal Pap smear of cervix 11/26/20 LSIL 03/11/2021    Past Surgical History:  Procedure Laterality Date   NO PAST SURGERIES     XI ROBOTIC LAPAROSCOPIC ASSISTED APPENDECTOMY N/A 09/14/2021   Procedure: XI ROBOTIC LAPAROSCOPIC ASSISTED APPENDECTOMY;  Surgeon: Sung Amabile, DO;  Location: [facility] ORS;  Service: General;  Laterality: N/A;    No Known Allergies  Family History  Problem Relation Age of Onset   Healthy Mother    Healthy Father    Healthy Brother    Healthy Brother     Social History   Tobacco Use   Smoking status: Never    Passive exposure: Never   Smokeless tobacco: Never   Tobacco comments:    Denies secondhand smoke  Vaping Use   Vaping Use: Never used  Substance Use Topics   Alcohol use: Not Currently    Comment: occasionally before pregnancy   Drug use: Not Currently     Pertinent GI related history and allergies were reviewed with the patient  Review of Systems  Constitutional:  Negative for activity change, appetite change, chills, diaphoresis, fatigue, fever and unexpected weight change.  HENT:  Negative for trouble swallowing and voice change.   Respiratory:  Negative for shortness of breath and wheezing.   Cardiovascular:  Negative for chest pain, palpitations and leg swelling.  Gastrointestinal:  Positive for blood in stool and constipation. Negative for abdominal distention, abdominal pain, anal bleeding, diarrhea, nausea, rectal pain and vomiting.       Pain with defecation  Musculoskeletal:  Negative for arthralgias and myalgias.  Skin:  Positive for rash (itching, over abdomen). Negative for color change and pallor.  Neurological:  Negative for dizziness, syncope and weakness.  Psychiatric/Behavioral:  Negative for confusion.   All other systems reviewed and are negative.   Medications Home  Medications No current facility-administered medications on file prior to encounter.   Current Outpatient Medications on File Prior to Encounter  Medication Sig Dispense Refill   acetaminophen (TYLENOL) 325 MG tablet Take 2 tablets (650 mg total) by mouth every 8 (eight) hours as needed for mild pain. 40 tablet 0   docusate sodium (COLACE) 100 MG capsule Take 1 capsule (100 mg total) by mouth 2 (two) times daily as  needed for up to 10 days for mild constipation. 20 capsule 0   ibuprofen (ADVIL) 800 MG tablet Take 1 tablet (800 mg total) by mouth every 8 (eight) hours as needed for mild pain or moderate pain. 30 tablet 0   oxyCODONE (ROXICODONE) 5 MG immediate release tablet Take 1 tablet (5 mg total) by mouth every 8 (eight) hours as needed for up to 10 doses. 10 tablet 0   Pertinent GI related medications were reviewed with the patient  Inpatient Medications  Current Facility-Administered Medications:    diphenhydrAMINE-zinc acetate (BENADRYL) 2-0.1 % cream, , Topical, TID PRN, Arnaldo Natal, MD  Current Outpatient Medications:    docusate sodium (COLACE) 100 MG capsule, Take 1 capsule (100 mg total) by mouth 2 (two) times daily., Disp: 60 capsule, Rfl: 2   lidocaine (XYLOCAINE) 2 % solution, Use as directed 15 mLs in the mouth or throat as needed for mouth pain. Apply a small amount to the rectum as needed before stooling and up to 3 times a day if it hurts.  Do not apply anything to the mouth.  The instructions about the mouth are computer-generated and cannot be deleted, Disp: 30 mL, Rfl: 1   polyethylene glycol (MIRALAX / GLYCOLAX) 17 g packet, Take 17 g by mouth 2 (two) times daily., Disp: 60 each, Rfl: 0   acetaminophen (TYLENOL) 325 MG tablet, Take 2 tablets (650 mg total) by mouth every 8 (eight) hours as needed for mild pain., Disp: 40 tablet, Rfl: 0   docusate sodium (COLACE) 100 MG capsule, Take 1 capsule (100 mg total) by mouth 2 (two) times daily as needed for up to 10 days for mild constipation., Disp: 20 capsule, Rfl: 0   ibuprofen (ADVIL) 800 MG tablet, Take 1 tablet (800 mg total) by mouth every 8 (eight) hours as needed for mild pain or moderate pain., Disp: 30 tablet, Rfl: 0   oxyCODONE (ROXICODONE) 5 MG immediate release tablet, Take 1 tablet (5 mg total) by mouth every 8 (eight) hours as needed for up to 10 doses., Disp: 10 tablet, Rfl: 0   diphenhydrAMINE-zinc acetate   Objective    Vitals:   09/21/21 1113 09/21/21 1114 09/21/21 1117 09/21/21 1525  BP: 110/74   110/69  Pulse: 99   (!) 101  Resp: 16   19  Temp:   97.7 F (36.5 C) 98.1 F (36.7 C)  TempSrc: Oral   Oral  SpO2: 100%   100%  Weight:  83.5 kg    Height:  5\' 7"  (1.702 m)       Physical Exam Vitals and nursing note reviewed.  Constitutional:      General: She is not in acute distress.    Appearance: Normal appearance. She is not ill-appearing, toxic-appearing or diaphoretic.  HENT:     Head: Normocephalic and atraumatic.     Nose: Nose normal.     Mouth/Throat:     Mouth: Mucous membranes are moist.     Pharynx: Oropharynx is clear.  Eyes:     General: No scleral icterus.  Extraocular Movements: Extraocular movements intact.  Cardiovascular:     Rate and Rhythm: Normal rate and regular rhythm.     Heart sounds: Normal heart sounds. No murmur heard.   No friction rub. No gallop.  Pulmonary:     Effort: Pulmonary effort is normal. No respiratory distress.     Breath sounds: Normal breath sounds. No wheezing, rhonchi or rales.  Abdominal:     General: Bowel sounds are normal. There is no distension.     Palpations: Abdomen is soft.     Tenderness: There is no abdominal tenderness. There is no guarding or rebound.     Comments: Surgical ex lap scars CDI Skin overlying abdomen appears irritated- likely from recent shaving with surgical intervention; pt notes itching, but denies scratching  Genitourinary:    Comments: DRE performed by EM provider and notes tenderness on exam. Blood demonstrated on pictures provided by patient. I performed external exam with patient permission as she did not want an additional DRE performed given the tenderness previously. Accompanied by nursing- Barbee Cough. Musculoskeletal:     Cervical back: Neck supple.     Right lower leg: No edema.     Left lower leg: No edema.  Skin:    General: Skin is warm and dry.     Coloration: Skin is not jaundiced or pale.      Findings: Rash (see abdomen comments) present.     Comments: No increased warmth to touch  Neurological:     General: No focal deficit present.     Mental Status: She is alert and oriented to person, place, and time. Mental status is at baseline.  Psychiatric:        Mood and Affect: Mood normal.        Behavior: Behavior normal.        Thought Content: Thought content normal.        Judgment: Judgment normal.    Laboratory Data Recent Labs  Lab 09/15/21 1445 09/16/21 0255 09/21/21 1124  WBC 14.9* 11.9* 8.4  HGB 11.0* 10.4* 11.8*  HCT 32.5* 31.3* 35.4*  PLT 285 277 369   Recent Labs  Lab 09/15/21 0236 09/16/21 0255 09/21/21 1124  NA 134* 138 136  K 3.9 3.4* 4.1  CL 105 106 103  CO2 BUN CALCIUM 8.6* 8.3* 9.1  PROT  --   --  8.3*  BILITOT  --   --  0.6  ALKPHOS  --   --  73  ALT  --   --  28  AST  --   --  23  GLUCOSE 158* 109* 106*   No results for input(s): INR in the last 168 hours.  No results for input(s): LIPASE in the last 72 hours.      Imaging Studies: 09/14/21 CT abdomen/pelvis with contrast reviewed  Assessment:   # Rectal pain with hematochezia- likely Rectal Fissure - dealing with constipation for extended period of time - pain with BM for 1 month; blood starting overnight - EM provider performed rectal exam with tenderness on exam and pt showed pictures on her phone of blood/clot in toilet. Stool appears to be brown itself - no fhx crc or colon polyps - no other n/v or other red flag signs  # recent appendicitis - appendectomy 5 d ago  Plan:  Labs reviewed - hgb stable and improved from recent hospitalization for appendicitis CT from 12/5 aside from appendicitis does not demonstrate GI abnormality  Recommend twice daily sitz baths for 10 days Recommend twice daily miralax (1 capful each time) and one 100mg  docusate twice a day Can provide 2% lidocaine jelly for pain relief.  Recommended to avoid opioids, nsaids, and asa  as this may worsen constipation and bleeding respectively If continued issues, can consider other topical agents as outpatient and potentially colonoscopy Follow up as outpatient with GI Discussed case with patient who is amenable to plan and emergency medicine provider  I personally performed the service.  Management of other medical comorbidities as per primary team  Thank you for allowing to participate in this patient's care. Please don't hesitate to call if any questions or concerns arise.   [place], DO [facility]  Portions of the record may have been created with voice recognition software. Occasional wrong-word or 'sound-a-like' substitutions may have occurred due to the inherent limitations of voice recognition software.  Read the chart carefully and recognize, using context, where substitutions may have occurred.

## 2021-09-21 NOTE — ED Provider Notes (Addendum)
[facility] Emergency Department Provider Note   ____________________________________________   Event Date/Time   First MD Initiated Contact with Patient 09/21/21 1526     (approximate)  I have reviewed the triage vital signs and the nursing notes.   HISTORY  Chief Complaint Post-op Problem and GI Bleeding   HPI Nicole Rocha is a 20 y.o. female who reports rectal bleeding for about 1 month.  She is sure it is rectal bleeding.  Happens often with stools.  Today she had some blood clots come out as well.  She has not had that before.  She has some pain when she stools as well.  She had an appendectomy 5 days ago and reports today she got some redness on the abdominal wall skin.  The redness is in the pattern of some sticky patches on the upper abdomen and there is a line on the left lateral side of the abdomen appears to be the edge of a dressing.  Redness appears to be some delayed reaction to the dressing.  Patient has occasional abdominal pain but nothing today.  She has not had diarrhea.         Past Medical History:  Diagnosis Date   Abnormal Pap smear of cervix 11/26/20 LSIL 03/11/2021    Patient Active Problem List   Diagnosis Date Noted   Acute appendicitis 09/14/2021   [redacted] weeks gestation of pregnancy 06/25/2021   Encounter for planned induction of labor 06/22/2021   Abnormal glucose affecting pregnancy - Equivocal 3 hGTT 03/27/2021   Abnormal Pap smear of cervix 11/26/20 LSIL 03/11/2021   Anemia affecting pregnancy 03/11/2021   Supervision of other normal pregnancy, antepartum 11/26/2020    Past Surgical History:  Procedure Laterality Date   NO PAST SURGERIES     XI ROBOTIC LAPAROSCOPIC ASSISTED APPENDECTOMY N/A 09/14/2021   Procedure: XI ROBOTIC LAPAROSCOPIC ASSISTED APPENDECTOMY;  Surgeon: Sung Amabile, DO;  Location: [facility] ORS;  Service: General;  Laterality: N/A;    Prior to Admission medications   Medication Sig Start Date End  Date Taking? Authorizing Provider  acetaminophen (TYLENOL) 325 MG tablet Take 2 tablets (650 mg total) by mouth every 8 (eight) hours as needed for mild pain. 09/15/21 10/15/21  Tonna Boehringer, Isami, DO  docusate sodium (COLACE) 100 MG capsule Take 1 capsule (100 mg total) by mouth 2 (two) times daily as needed for up to 10 days for mild constipation. 09/15/21 09/25/21  Tonna Boehringer, Isami, DO  ibuprofen (ADVIL) 800 MG tablet Take 1 tablet (800 mg total) by mouth every 8 (eight) hours as needed for mild pain or moderate pain. 09/15/21   Tonna Boehringer, Isami, DO  oxyCODONE (ROXICODONE) 5 MG immediate release tablet Take 1 tablet (5 mg total) by mouth every 8 (eight) hours as needed for up to 10 doses. 09/15/21   Sung Amabile, DO    Allergies Patient has no known allergies.  Family History  Problem Relation Age of Onset   Healthy Mother    Healthy Father    Healthy Brother    Healthy Brother     Social History Social History   Tobacco Use   Smoking status: Never    Passive exposure: Never   Smokeless tobacco: Never   Tobacco comments:    Denies secondhand smoke  Vaping Use   Vaping Use: Never used  Substance Use Topics   Alcohol use: Not Currently    Comment: occasionally before pregnancy   Drug use: Not Currently    Review of Systems  Constitutional: No fever/chills Eyes: No visual changes. ENT: No sore throat. Cardiovascular: Denies chest pain. Respiratory: Denies shortness of breath. Gastrointestinal: No abdominal pain.  No nausea, no vomiting.  No diarrhea.  No constipation. Genitourinary: Negative for dysuria. Musculoskeletal: Negative for back pain. Skin: Negative for rash. Neurological: Negative for headaches, focal weakness   ____________________________________________   PHYSICAL EXAM:  VITAL SIGNS: ED Triage Vitals  Enc Vitals Group     BP 09/21/21 1113 110/74     Pulse Rate 09/21/21 1113 99     Resp 09/21/21 1113 16     Temp 09/21/21 1117 97.7 F (36.5 C)     Temp Source  09/21/21 1113 Oral     SpO2 09/21/21 1113 100 %     Weight 09/21/21 1114 184 lb (83.5 kg)     Height 09/21/21 1114 5\' 7"  (1.702 m)     Head Circumference --      Peak Flow --      Pain Score 09/21/21 1114 10     Pain Loc --      Pain Edu? --      Excl. in GC? --     Constitutional: Alert and oriented. Well appearing and in no acute distress. Eyes: Conjunctivae are normal.  Head: Atraumatic. Nose: No congestion/rhinnorhea. Mouth/Throat: Mucous membranes are moist.  Oropharynx non-erythematous. Neck: No stridor.  Cardiovascular: Normal rate, regular rhythm. Grossly normal heart sounds.  Good peripheral circulation. Respiratory: Normal respiratory effort.  No retractions. Lungs CTAB. Gastrointestinal: Soft and nontender. No distention. No abdominal bruits.  Rectal: Patient complains of pain on the rectal exam.  There are some small flecks of blood on my finger along with some brown stool.  This material was all Hemoccult positive.  There were no hemorrhoids palpated or masses palpated musculoskeletal: No lower extremity tenderness nor edema.   Neurologic:  Normal speech and language. No gross focal neurologic deficits are appreciated. No gait instability. Skin:  Skin is warm, dry and intact. No rash noted except as noted in HPI.   ____________________________________________   LABS (all labs ordered are listed, but only abnormal results are displayed)  Labs Reviewed  COMPREHENSIVE METABOLIC PANEL - Abnormal; Notable for the following components:      Result Value   Glucose, Bld 106 (*)    Total Protein 8.3 (*)    All other components within normal limits  CBC - Abnormal; Notable for the following components:   Hemoglobin 11.8 (*)    HCT 35.4 (*)    All other components within normal limits  POC OCCULT BLOOD, ED  POC URINE PREG, ED  TYPE AND SCREEN   ____________________________________________  EKG   ____________________________________________  RADIOLOGY 14/12/22, personally viewed and evaluated these images (plain radiographs) as part of my medical decision making, as well as reviewing the written report by the radiologist.  ED MD interpretation:    Official radiology report(s): No results found.  ____________________________________________   PROCEDURES  Procedure(s) performed (including Critical Care):  Procedures   ____________________________________________   INITIAL IMPRESSION / ASSESSMENT AND PLAN / ED COURSE  Patient's H&H is higher today than it has been recently.  Patient is not lightheaded although vital signs are slightly orthostatic by pulse pressure actually goes up.  Pulse goes from 92-1 08 lying to standing pressure goes from 97 over 71-101/64 lying to standing.     ----------------------------------------- 4:50 PM on 09/21/2021 ----------------------------------------- Discussed patient with Dr. 14/09/2021 GI.  He will follow-up outpatient.  ____________________________________________   FINAL CLINICAL IMPRESSION(S) / ED DIAGNOSES  Final diagnoses:  Rectal bleeding     ED Discharge Orders     None        Note:  This document was prepared using Dragon voice recognition software and may include unintentional dictation errors.    Arnaldo Natal, MD 09/21/21 1650 On discharge the patient reports she has had blurry vision for 2 weeks but does not have any now.  Since she does not have any now I will have her follow-up with eye doctor if it continues.   Arnaldo Natal, MD 09/21/21 [phone]

## 2022-01-18 ENCOUNTER — Ambulatory Visit (LOCAL_COMMUNITY_HEALTH_CENTER): Payer: Self-pay | Admitting: Family Medicine

## 2022-01-18 ENCOUNTER — Encounter: Payer: Self-pay | Admitting: Family Medicine

## 2022-01-18 ENCOUNTER — Ambulatory Visit: Payer: Self-pay

## 2022-01-18 VITALS — BP 119/71 | Ht 67.0 in | Wt 193.0 lb

## 2022-01-18 DIAGNOSIS — Z30017 Encounter for initial prescription of implantable subdermal contraceptive: Secondary | ICD-10-CM

## 2022-01-18 DIAGNOSIS — Z01419 Encounter for gynecological examination (general) (routine) without abnormal findings: Secondary | ICD-10-CM

## 2022-01-18 DIAGNOSIS — Z1272 Encounter for screening for malignant neoplasm of vagina: Secondary | ICD-10-CM

## 2022-01-18 DIAGNOSIS — Z3200 Encounter for pregnancy test, result unknown: Secondary | ICD-10-CM

## 2022-01-18 LAB — PREGNANCY, URINE: Preg Test, Ur: NEGATIVE

## 2022-01-18 MED ORDER — ETONOGESTREL 68 MG ~~LOC~~ IMPL
68.0000 mg | DRUG_IMPLANT | Freq: Once | SUBCUTANEOUS | Status: AC
  Administered 2022-01-18: 68 mg via SUBCUTANEOUS

## 2022-01-18 NOTE — Progress Notes (Signed)
Patient here for PE, PAP and Nexplanon inserted. Condoms declined.  ?

## 2022-01-18 NOTE — Progress Notes (Signed)
[facility] DEPARTMENT ?Family Planning Clinic ?[address] [employer] ?Main Number: [phone] ? ?Family Planning Visit- Repeat Yearly Visit ? ?Subjective:  ?Nicole Rocha is a 21 y.o. G2P1011  being seen today for an annual wellness visit and to discuss contraception options.   The patient is currently using No Method - No Contraceptive Precautions for pregnancy prevention. Patient does not want a pregnancy in the next year.  ? ?she/her/hers report they are looking for a method that provides High efficacy at preventing pregnancy, Discrete method, Long term method, and Methods that does not involve too much memory ? ? ?Patient has the following medical problems: has Supervision of other normal pregnancy, antepartum; Abnormal Pap smear of cervix 11/26/20 LSIL; Anemia affecting pregnancy; Abnormal glucose affecting pregnancy - Equivocal 3 hGTT; Encounter for planned induction of labor; [redacted] weeks gestation of pregnancy; and Acute appendicitis on their problem list. ? ?Chief Complaint  ?Patient presents with  ? Annual Exam  ? Contraception  ? ? ?Patient reports here for physical and nexplanon  ? ?Patient denies any concerns   ? ?See flowsheet for other program required questions.  ? ?Body mass index is 30.23 kg/m?. - Patient is eligible for diabetes screening based on BMI and age >55?  not applicable ?HA1C ordered? not applicable ? ?Patient reports 1 of partners in last year. Desires STI screening?  No - declined  ? ? ?Has patient been screened once for HCV in the past?  Yes ? No results found for: HCVAB ? ?Does the patient have current of drug use, have a partner with drug use, and/or has been incarcerated since last result? No  ?If yes-- Screen for HCV through [facility] ?  ?Does the patient meet criteria for HBV testing? No ? ?Criteria:  ?-Household, sexual or needle sharing contact with HBV ?-History of drug use ?-HIV positive ?-Those with known Hep C ? ? ?Health Maintenance Due  ?Topic  Date Due  ? HPV VACCINES (1 - 2-dose series) Never done  ? ? ?Review of Systems  ?Constitutional:  Negative for chills, fever, malaise/fatigue and weight loss.  ?HENT:  Negative for congestion, hearing loss and sore throat.   ?Eyes:  Negative for blurred vision, double vision and photophobia.  ?Respiratory:  Negative for shortness of breath.   ?Cardiovascular:  Negative for chest pain.  ?Gastrointestinal:  Negative for abdominal pain, blood in stool, constipation, diarrhea, heartburn, nausea and vomiting.  ?Genitourinary:  Negative for dysuria and frequency.  ?Musculoskeletal:  Negative for back pain, joint pain and neck pain.  ?Skin:  Negative for itching and rash.  ?Neurological:  Negative for dizziness, weakness and headaches.  ?Endo/Heme/Allergies:  Does not bruise/bleed easily.  ?Psychiatric/Behavioral:  Negative for depression, substance abuse and suicidal ideas.   ? ?The following portions of the patient's history were reviewed and updated as appropriate: allergies, current medications, past family history, past medical history, past social history, past surgical history and problem list. Problem list updated. ? ?Objective:  ? ?Vitals:  ? 01/18/22 1357  ?BP: 119/71  ?Weight: 193 lb (87.5 kg)  ?Height: 5\' 7"  (1.702 m)  ? ? ?Physical Exam ?Vitals and nursing note reviewed.  ?Constitutional:   ?   Appearance: Normal appearance.  ?HENT:  ?   Head: Normocephalic and atraumatic.  ?   Mouth/Throat:  ?   Mouth: Mucous membranes are moist.  ?   Pharynx: No oropharyngeal exudate or posterior oropharyngeal erythema.  ?Eyes:  ?   General: No scleral icterus. ?Cardiovascular:  ?  Rate and Rhythm: Normal rate.  ?   Pulses: Normal pulses.  ?Pulmonary:  ?   Effort: Pulmonary effort is normal.  ?Abdominal:  ?   General: Abdomen is flat. Bowel sounds are normal.  ?   Palpations: Abdomen is soft.  ?Genitourinary: ?   Comments: External genitalia without, lice, nits, erythema, edema , lesions or inguinal adenopathy. Vagina with  normal mucosa and discharge and pH equals 4.  Cervix without visual lesions, uterus firm, mobile, non-tender, no masses, CMT adnexal fullness or tenderness.  ? ?Musculoskeletal:     ?   General: Normal range of motion.  ?Skin: ?   General: Skin is warm and dry.  ?Neurological:  ?   General: No focal deficit present.  ?   Mental Status: She is alert.  ? ? ? ? ?Assessment and Plan:  ?Nicole Rocha is a 21 y.o. female G2P1011 presenting to the [facility] for an yearly wellness and contraception visit ? ? ?Contraception counseling: Reviewed options based on patient desire and reproductive life plan. Patient is interested in Hormonal Implant. This was provided to the patient today.  ? ?Risks, benefits, and typical effectiveness rates were reviewed.  Questions were answered.  Written information was also given to the patient to review.   ? ?The patient will follow up in  as needed for surveillance.  The patient was told to call with any further questions, or with any concerns about this method of contraception.  Emphasized use of condoms 100% of the time for STI prevention. ? ?Patient was assessed for need for ECP. Patient was offered ECP based on last sex. Sex was protected.  Patient is within 0  days of unprotected sex. Patient was offered ECP. Reviewed options and patient desired No method of ECP, declined all   ? ? ?1. Encounter for pregnancy test, result unknown ? ?- Pregnancy, urine ? ?2. Smear, vaginal, as part of routine gynecological examination ?Well woman exam  ?No PP Exam for patient  ?CBE due 2025 ?Pap today, pap 1 year ago was LSIL ?- IGP, rfx Aptima HPV ASCU ? ?3. Nexplanon insertion ?Nexplanon Insertion Procedure ?Patient identified, informed consent performed, consent signed.   Patient does understand that irregular bleeding is a very common side effect of this medication. She was advised to have backup contraception after placement. Patient was determined to meet WHO  criteria for not being pregnant. Appropriate time out taken.  The insertion site was identified 8-10 cm (3-4 inches) from the medial epicondyle of the humerus and 3-5 cm (1.25-2 inches) posterior to (below) the sulcus (groove) between the biceps and triceps muscles of the patient's left  arm and marked.  Patient was prepped with alcohol swab and then injected with 3 ml of 1% lidocaine.  Arm was prepped with chlorhexidene, Nexplanon removed from packaging,  Device confirmed in needle, then inserted full length of needle and withdrawn per handbook instructions. Nexplanon was able to palpated in the patient's arm; patient palpated the insert herself. There was minimal blood loss.  Patient insertion site covered with guaze and a pressure bandage to reduce any bruising.  The patient tolerated the procedure well and was given post procedure instructions.  ? ?Counseled patient to take OTC analgesic starting as soon as lidocaine starts to wear off and take regularly for at least 48 hr to decrease discomfort.  Specifically to take with food or milk to decrease stomach upset and for IB 600 mg (3 tablets) every 6 hrs; IB 800  mg (4 tablets) every 8 hrs; or Aleve 2 tablets every 12 hrs. ? ?- etonogestrel (NEXPLANON) implant 68 mg ? ? ?[facility] agency interpreter used for Spanish interpretation.     ? ?Return for as needed. ? ?No future appointments. ? ?Wendi Snipes, FNP ? ?

## 2022-01-26 LAB — IGP, RFX APTIMA HPV ASCU: PAP Smear Comment: 0

## 2022-03-23 IMAGING — CT CT ABD-PELV W/ CM
2 of 4 series · 16 of 46 positions shown, 18 images · IV contrast (APPLIED)
Comparison: None.

CLINICAL DATA: Abdominal pain since this a.m.

EXAM:
CT ABDOMEN AND PELVIS WITH CONTRAST
TECHNIQUE: Multidetector CT imaging of the abdomen and pelvis was performed
using the standard protocol following bolus administration of
intravenous contrast.
CONTRAST:  100mL OMNIPAQUE IOHEXOL 300 MG/ML  SOLN

[Series 2: routine abd/pel with · axial · 0.88mm/px · z∈[-1032,-562]mm · 13 of 104 slices shown, 15 images]
[im 5/104  soft-tissue]
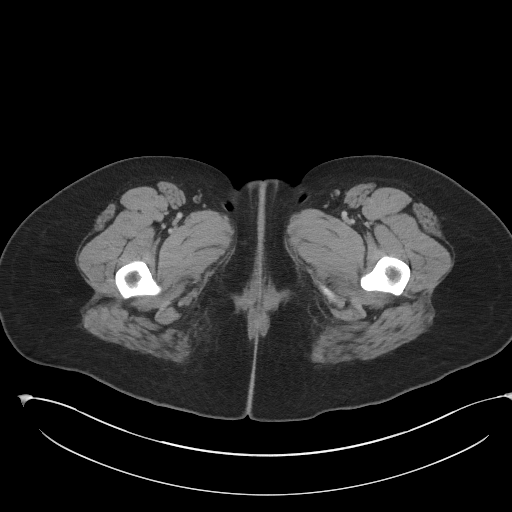
[im 5/104  bone]
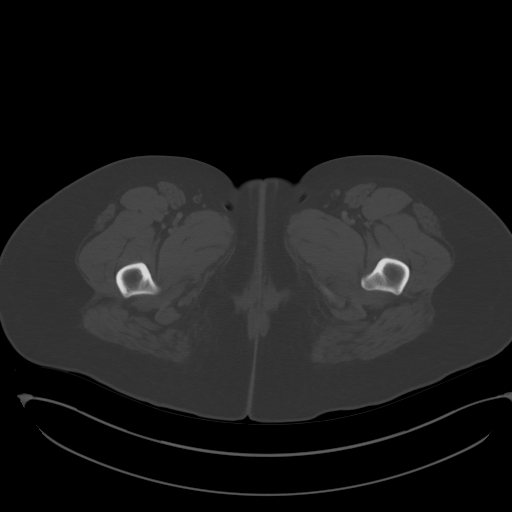
[im 13/104  soft-tissue]
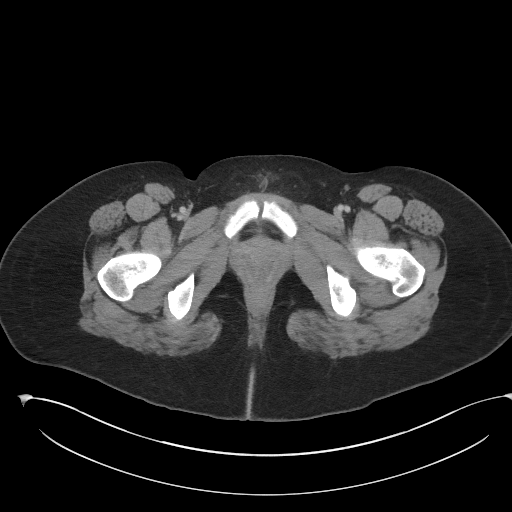
[im 21/104  soft-tissue]
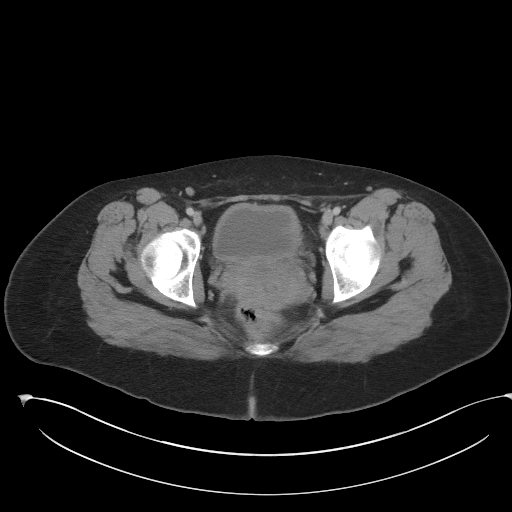
[im 29/104  soft-tissue]
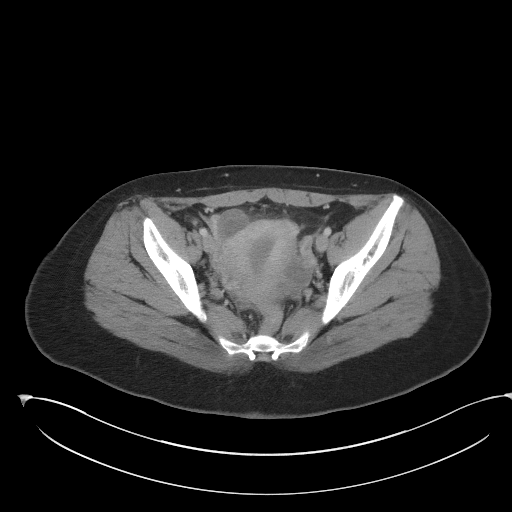
[im 38/104  soft-tissue]
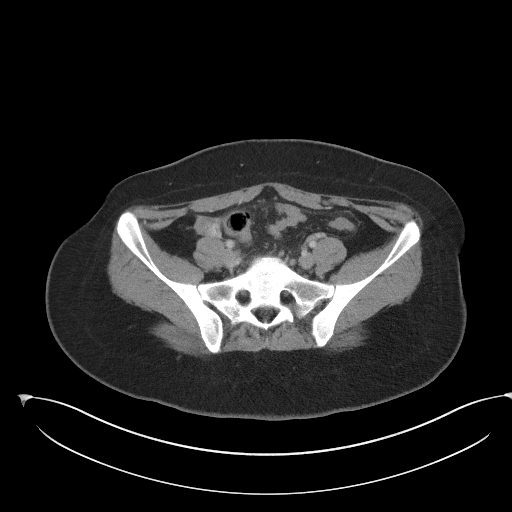
[im 46/104  soft-tissue]
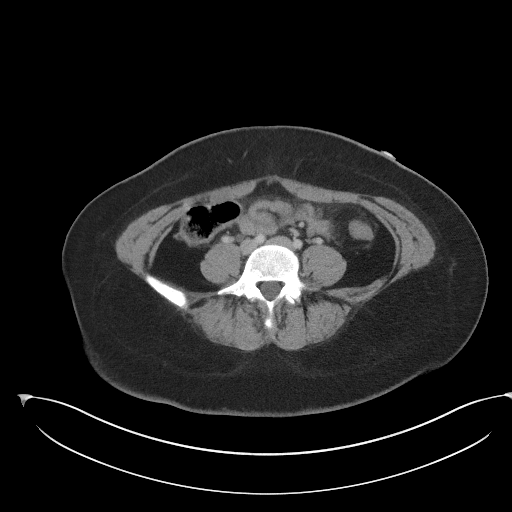
[im 54/104  soft-tissue]
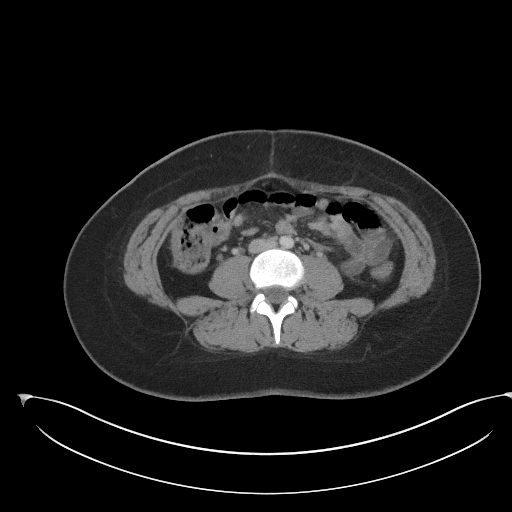
[im 58/104  soft-tissue]
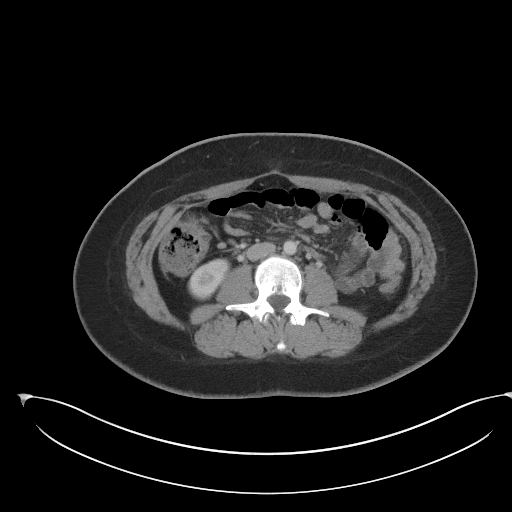
[im 66/104  soft-tissue]
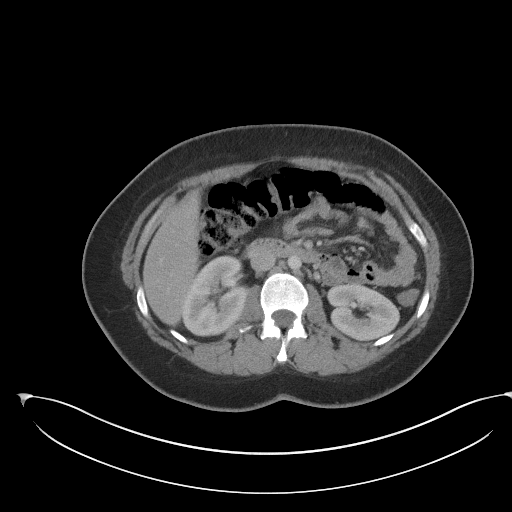
[im 66/104  bone]
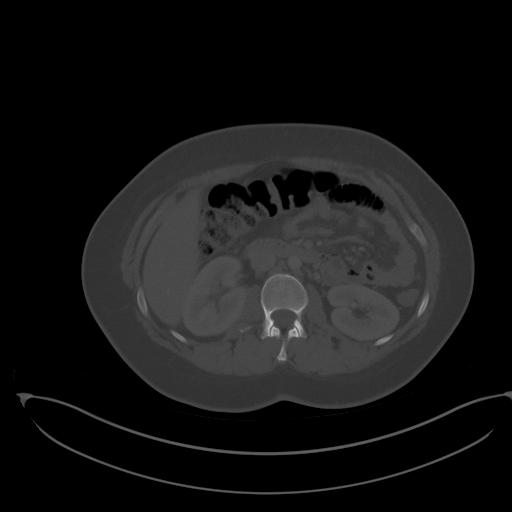
[im 75/104  soft-tissue]
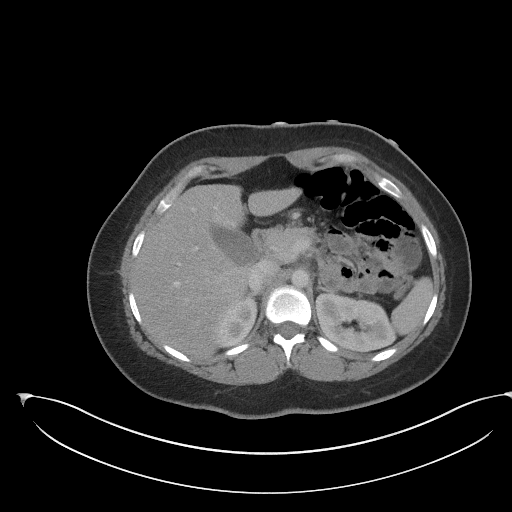
[im 83/104  soft-tissue]
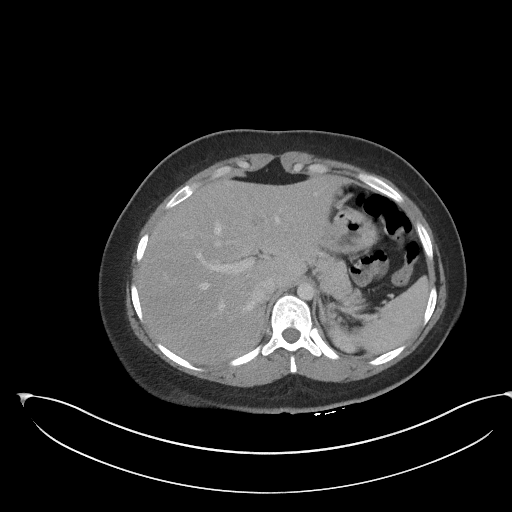
[im 91/104  soft-tissue]
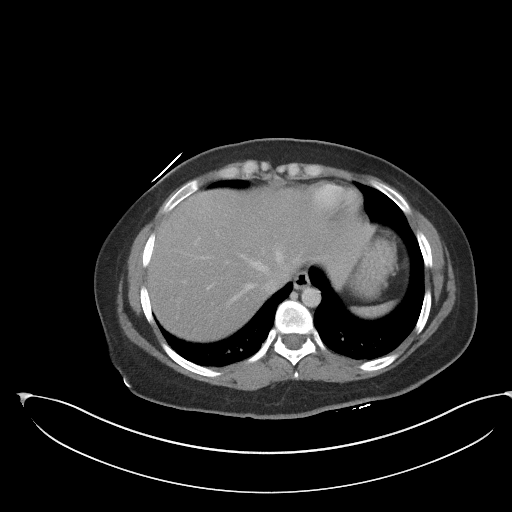
[im 99/104  soft-tissue]
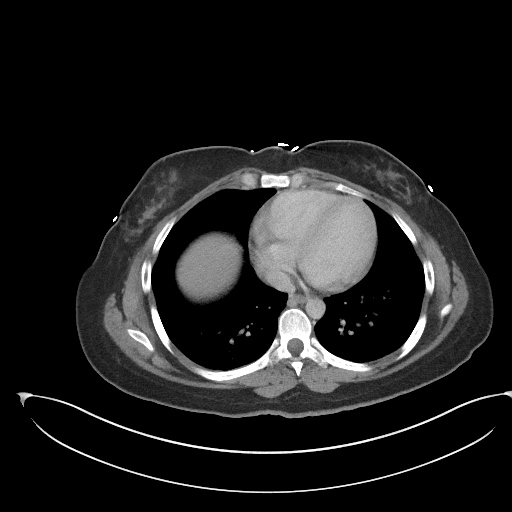

[Series 5: coronal st · coronal · 0.91mm/px · 3 of 89 slices shown]
[im 30/89  soft-tissue]
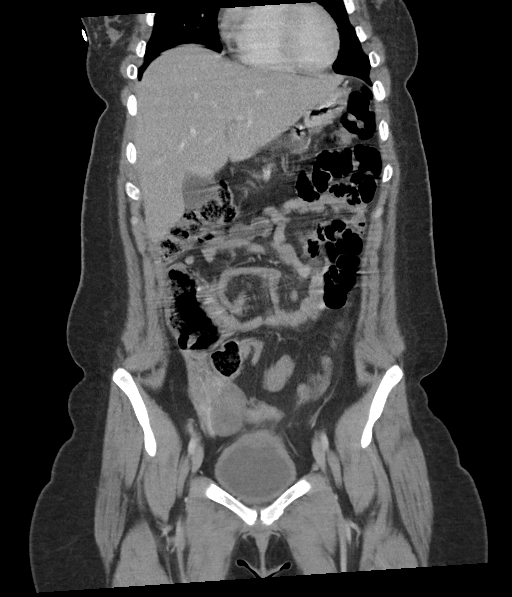
[im 40/89  soft-tissue]
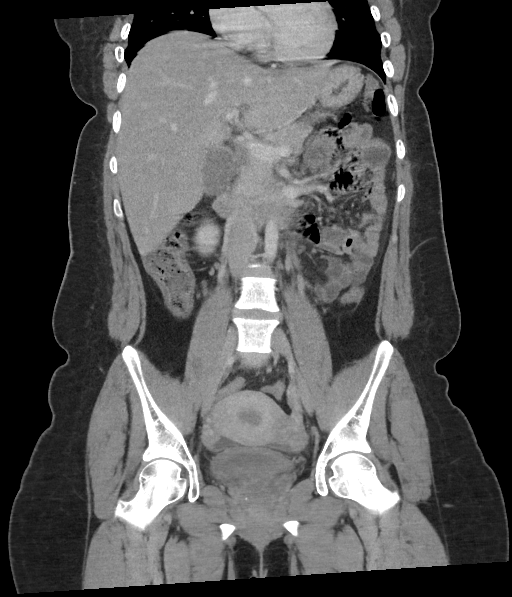
[im 49/89  soft-tissue]
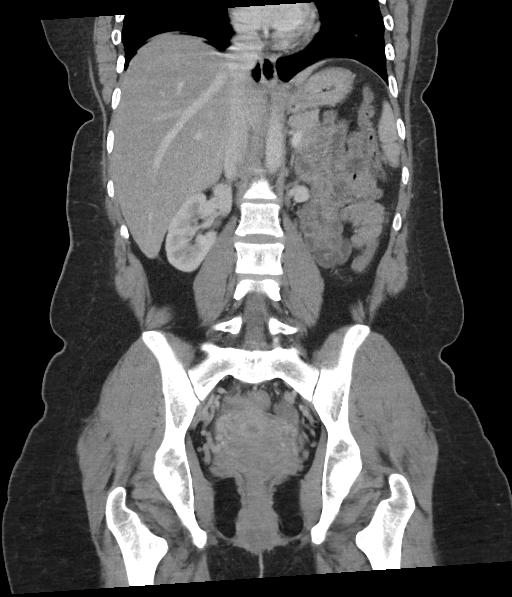

[16 of 46 positions shown; findings below may reference images not displayed]

FINDINGS: Lower chest: No acute abnormality.

Hepatobiliary: No suspicious hepatic lesion. Gallbladder is
unremarkable. No biliary ductal dilation.

Pancreas: No pancreatic ductal dilation or evidence of acute
inflammation.

Spleen: Within normal limits.

Adrenals/Urinary Tract: Bilateral adrenal glands are unremarkable.
No hydronephrosis. No solid enhancing renal mass. Kidneys
demonstrate symmetric enhancement. Mild wall thickening of an
incompletely distended urinary bladder.

Stomach/Bowel: No enteric contrast was administered. Stomach is
decompressed limiting evaluation. No pathologic dilation of small or
large bowel. Terminal ileum appears normal.

The appendix is dilated and inflamed measuring up to 12 mm.

Vascular/Lymphatic: No significant vascular findings are present. No
enlarged abdominal or pelvic lymph nodes.

Reproductive: Uterus is unremarkable. 2.5 cm right ovarian cyst and
a 2.2 cm left ovarian cyst. No follow-up imaging is recommended.
Reference: JACR [DATE]):[id number]

Other: Trace pelvic free fluid which may be physiologic or related
to appendiceal inflammation. No walled off fluid collections. No
pneumoperitoneum.

Musculoskeletal: No acute or significant osseous findings.
IMPRESSION: Acute appendicitis without evidence of perforation, no drainable
fluid collection.

## 2022-06-07 ENCOUNTER — Encounter: Payer: Self-pay | Admitting: Advanced Practice Midwife

## 2022-06-07 ENCOUNTER — Ambulatory Visit (LOCAL_COMMUNITY_HEALTH_CENTER): Payer: Self-pay | Admitting: Advanced Practice Midwife

## 2022-06-07 VITALS — BP 114/76 | HR 91 | Temp 97.8°F | Wt 198.4 lb

## 2022-06-07 DIAGNOSIS — R87612 Low grade squamous intraepithelial lesion on cytologic smear of cervix (LGSIL): Secondary | ICD-10-CM

## 2022-06-07 DIAGNOSIS — Z3046 Encounter for surveillance of implantable subdermal contraceptive: Secondary | ICD-10-CM

## 2022-06-07 NOTE — Progress Notes (Signed)
Nexplanon Post Removal Instructions in Spanish given and reviewed with patient. Patient given booklet titled " Are You Ready For A Baby" given. [employer] utilized ID# [id number]. Bthiele RN

## 2022-06-07 NOTE — Progress Notes (Signed)
[facility] [address] Main Number: [phone]  Contraception/Family Planning VISIT ENCOUNTER NOTE  Subjective:   Nicole Rocha is a 21 y.o.SHF nonsmoker G48P1011 (1 yo daughter) female here for reproductive life counseling. The patient is currently using Hormonal Implant to prevent pregnancy.  Wants Nexplanon removed due to weight gain and wants to conceive. Nexplanon inserted 01/18/22. Last sex 06/06/22 without condom.   The patient does want a pregnancy in the next year.    Client states they are looking for the following:  Other wants to conceive  Denies abnormal vaginal bleeding, discharge, pelvic pain, problems with intercourse or other gynecologic concerns.    Gynecologic History No LMP recorded (lmp unknown). Patient has had an implant.  Health Maintenance Due  Topic Date Due   COVID-19 Vaccine (1) Never done   HPV VACCINES (1 - 2-dose series) Never done   CHLAMYDIA SCREENING  05/22/2022   INFLUENZA VACCINE  05/11/2022     The following portions of the patient's history were reviewed and updated as appropriate: allergies, current medications, past family history, past medical history, past social history, past surgical history and problem list.  Review of Systems Pertinent items are noted in HPI.   Objective:  BP 114/76   Pulse 91   Temp 97.8 F (36.6 C)   Wt 198 lb 6.4 oz (90 kg)   LMP  (LMP Unknown) Comment: no periods  Breastfeeding No   BMI 31.07 kg/m  Gen: well appearing, NAD HEENT: no scleral icterus CV: RR Lung: Normal WOB Ext: warm well perfused     Assessment and Plan:   Need for emergency contraceptive care was assessed today.  Last unprotected sex was:  06/06/22 without condom   Patient reported Unprotected sex within past 72 hours.  Reviewed options and patient desired No method of ECP, declined all    Contraception counseling: Reviewed methods in a patient centered  fashion and used shared decision making with the patient. Utilized [employer] patient education tools as appropriate. The patient stated there goals and desires from a method are: Other wants to conceive  We reviewed the following methods in detail based on patient preferences available included: Female Condom  Patient expressed they would like No Method - Other Reason  This was provided to the patient today.  if not why not clearly documented  Risks, benefits, and typical effectiveness rates were reviewed.  Questions were answered.  Written information was also given to the patient to review.     she/her/hers  will follow up in  prn  for surveillance.  she/her/hers was told to call with any further questions, or with any concerns about this method of contraception or cycle control.  Emphasized use of condoms 100% of the time for STI prevention.   1. Nexplanon removal Nexplanon Removal Patient identified, informed consent performed, consent signed.   Appropriate time out taken. Nexplanon site identified.  Area prepped in usual sterile fashon. 3 ml of 1% lidocaine with Epinephrine was used to anesthetize the area at the distal end of the implant and along implant site. A small stab incision was made right beside the implant on the distal portion.  The Nexplanon rod was grasped using straight hemostats/manual and removed without difficulty.  There was minimal blood loss. There were no complications.  Steri-strips were applied over the small incision.  A pressure bandage was applied to reduce any bruising.  The patient tolerated the procedure well and was given post procedure  instructions.    Nexplanon:   Counseled patient to take OTC analgesic starting as soon as lidocaine starts to wear off and take regularly for at least 48 hr to decrease discomfort.  Specifically to take with food or milk to decrease stomach upset and for IB 600 mg (3 tablets) every 6 hrs; IB 800 mg (4 tablets) every 8 hrs; or  Aleve 2 tablets every 12 hrs.    2. Low grade squamous intraepithelial lesion on cytologic smear of cervix (LGSIL) Last pap 01/18/22 neg. 11/26/20 LSIL Needs pap 01/2025    Please refer to After Visit Summary for other counseling recommendations.   Return if symptoms worsen or fail to improve.  Alberteen Spindle, CNM [facility] DEPARTMENT

## 2022-09-08 ENCOUNTER — Ambulatory Visit (LOCAL_COMMUNITY_HEALTH_CENTER): Payer: Self-pay | Admitting: Advanced Practice Midwife

## 2022-09-08 VITALS — BP 118/76 | HR 81 | Temp 97.2°F | Wt 207.0 lb

## 2022-09-08 DIAGNOSIS — Z720 Tobacco use: Secondary | ICD-10-CM

## 2022-09-08 DIAGNOSIS — E669 Obesity, unspecified: Secondary | ICD-10-CM | POA: Insufficient documentation

## 2022-09-08 DIAGNOSIS — Z3009 Encounter for other general counseling and advice on contraception: Secondary | ICD-10-CM

## 2022-09-08 HISTORY — DX: Tobacco use: Z72.0

## 2022-09-08 LAB — PREGNANCY, URINE: Preg Test, Ur: NEGATIVE

## 2022-09-08 NOTE — Progress Notes (Signed)
   [facility]  Subjective:  Nicole Rocha is a 21 y.o. SHF G3P1 ( 1 year 2 mo old daughter) vaper being seen today for IUD insertion  Chief Complaint  Patient presents with   Contraception    IUD    HPI Nexplanon inserted 01/18/22 and removed 06/07/22 because pt states was gaining weight and wanted to conceive. Pt states she wants IUD now because she doesn't want to be pregnant. LMP 08/17/22. Last sex 09/06/22 without condom; with current partner x 6 mo; 1 partner in last 3 mo. Pt has been having sex without condom qoday since LMP. Pap 11/26/20 LSIL. Pap 01/18/22 neg. Vaping currently.   Does the patient have a current or past history of drug use? No   No components found for: "HCV"]   Health Maintenance Due  Topic Date Due   COVID-19 Vaccine (1) Never done   HPV VACCINES (1 - 2-dose series) Never done   INFLUENZA VACCINE  Never done   CHLAMYDIA SCREENING  05/22/2022    ROS  The following portions of the patient's history were reviewed and updated as appropriate: allergies, current medications, past family history, past medical history, past social history, past surgical history and problem list. Problem list updated.   See flowsheet for other program required questions.  Objective:   Vitals:   09/08/22 0954  BP: 118/76  Pulse: 81  Temp: (!) 97.2 F (36.2 C)  Weight: 207 lb (93.9 kg)    Physical Exam  Not performed  Assessment and Plan:  [name] is a 21 y.o. female presenting to the [facility] Department for a Women's Health problem visit  1. Family planning Counseled pt on high likelihood of pregnancy as been having unprotected sex qo day since LMP. PT neg today. Pt declines Plan B or any other type of birth control today. Counseled to return 09/22/22 for another pregnancy test, GC/Chlamydia before IUD insertion and to abstain or use back up condoms until that apt.   Counseled via 5 A's to stop vaping - Pregnancy, urine  2. Obesity, unspecified classification, unspecified obesity type, unspecified whether serious comorbidity present      Return in about 2 weeks (around 09/22/2022) for IUD insertion.  No future appointments.  Alberteen Spindle, CNM

## 2022-09-08 NOTE — Progress Notes (Addendum)
Per provider, patient left and refused STI testing and contraceptive options available.   Martyn Malay, RN

## 2022-09-27 ENCOUNTER — Other Ambulatory Visit: Payer: Self-pay

## 2022-09-27 ENCOUNTER — Encounter: Payer: Self-pay | Admitting: Emergency Medicine

## 2022-09-27 ENCOUNTER — Emergency Department
Admission: EM | Admit: 2022-09-27 | Discharge: 2022-09-27 | Disposition: A | Discharge status: Home / Self Care | Payer: Self-pay | Attending: Emergency Medicine | Admitting: Emergency Medicine

## 2022-09-27 ENCOUNTER — Emergency Department: Payer: Self-pay

## 2022-09-27 DIAGNOSIS — O4690 Antepartum hemorrhage, unspecified, unspecified trimester: Secondary | ICD-10-CM

## 2022-09-27 DIAGNOSIS — N83201 Unspecified ovarian cyst, right side: Secondary | ICD-10-CM | POA: Insufficient documentation

## 2022-09-27 DIAGNOSIS — N938 Other specified abnormal uterine and vaginal bleeding: Secondary | ICD-10-CM | POA: Insufficient documentation

## 2022-09-27 LAB — CBC WITH DIFFERENTIAL/PLATELET
Abs Immature Granulocytes: 0.03 10*3/uL (ref 0.00–0.07)
Basophils Absolute: 0 10*3/uL (ref 0.0–0.1)
Basophils Relative: 0 %
Eosinophils Absolute: 0.1 10*3/uL (ref 0.0–0.5)
Eosinophils Relative: 1 %
HCT: 40.2 % (ref 36.0–46.0)
Hemoglobin: 13.3 g/dL (ref 12.0–15.0)
Immature Granulocytes: 0 %
Lymphocytes Relative: 23 %
Lymphs Abs: 1.6 10*3/uL (ref 0.7–4.0)
MCH: 28.4 pg (ref 26.0–34.0)
MCHC: 33.1 g/dL (ref 30.0–36.0)
MCV: 85.7 fL (ref 80.0–100.0)
Monocytes Absolute: 0.4 10*3/uL (ref 0.1–1.0)
Monocytes Relative: 5 %
Neutro Abs: 4.9 10*3/uL (ref 1.7–7.7)
Neutrophils Relative %: 71 %
Platelets: 325 10*3/uL (ref 150–400)
RBC: 4.69 MIL/uL (ref 3.87–5.11)
RDW: 13.5 % (ref 11.5–15.5)
WBC: 7.1 10*3/uL (ref 4.0–10.5)
nRBC: 0 % (ref 0.0–0.2)

## 2022-09-27 LAB — URINALYSIS, ROUTINE W REFLEX MICROSCOPIC
Bilirubin Urine: NEGATIVE
Glucose, UA: NEGATIVE mg/dL
Ketones, ur: NEGATIVE mg/dL
Leukocytes,Ua: NEGATIVE
Nitrite: NEGATIVE
Protein, ur: NEGATIVE mg/dL
Specific Gravity, Urine: 1.026 (ref 1.005–1.030)
pH: 6 (ref 5.0–8.0)

## 2022-09-27 LAB — BASIC METABOLIC PANEL
Anion gap: 8 (ref 5–15)
BUN: 15 mg/dL (ref 6–20)
CO2: 25 mmol/L (ref 22–32)
Calcium: 9.1 mg/dL (ref 8.9–10.3)
Chloride: 108 mmol/L (ref 98–111)
Creatinine, Ser: 0.56 mg/dL (ref 0.44–1.00)
GFR, Estimated: >60 mL/min (ref >60)
Glucose, Bld: 113 mg/dL — ABNORMAL HIGH (ref 70–99)
Potassium: 3.7 mmol/L (ref 3.5–5.1)
Sodium: 141 mmol/L (ref 135–145)

## 2022-09-27 LAB — HCG, QUANTITATIVE, PREGNANCY: hCG, Beta Chain, Quant, S: 95 m[IU]/mL — ABNORMAL HIGH (ref <5)

## 2022-09-27 LAB — ABO/RH: ABO/RH(D): A POS

## 2022-09-27 NOTE — ED Triage Notes (Signed)
Says she is pregnant and today started having some bleed9ing.  Denies pain.

## 2022-09-27 NOTE — ED Provider Notes (Signed)
[facility] Provider Note    Event Date/Time   First MD Initiated Contact with Patient 09/27/22 1541     (approximate)   History   Vaginal Bleeding   HPI  Nicole Rocha is a 21 y.o. female presents to the emergency department for treatment and evaluation of vaginal bleeding. Home pregnancy test was positive. LMP August 14, 2022. G3P1.      Physical Exam   Triage Vital Signs: ED Triage Vitals [09/27/22 1318]  Enc Vitals Group     BP 113/78     Pulse Rate 91     Resp 14     Temp 98.6 F (37 C)     Temp Source Oral     SpO2 99 %     Weight 205 lb (93 kg)     Height      Head Circumference      Peak Flow      Pain Score 0     Pain Loc      Pain Edu?      Excl. in GC?     Most recent vital signs: Vitals:   09/27/22 1600 09/27/22 1843  BP: 102/63 102/60  Pulse: 72 70  Resp: 20 20  Temp:  98 F (36.7 C)  SpO2: 98% 98%     General: Awake, no distress.  CV:  Good peripheral perfusion.  Resp:  Normal effort.  Abd:  No distention.  Other:     ED Results / Procedures / Treatments   Labs (all labs ordered are listed, but only abnormal results are displayed) Labs Reviewed  BASIC METABOLIC PANEL - Abnormal; Notable for the following components:      Result Value   Glucose, Bld 113 (*)    All other components within normal limits  URINALYSIS, ROUTINE W REFLEX MICROSCOPIC - Abnormal; Notable for the following components:   Color, Urine YELLOW (*)    APPearance CLEAR (*)    Hgb urine dipstick MODERATE (*)    Bacteria, UA RARE (*)    All other components within normal limits  HCG, QUANTITATIVE, PREGNANCY - Abnormal; Notable for the following components:   hCG, Beta Chain, Quant, S 95 (*)    All other components within normal limits  CBC WITH DIFFERENTIAL/PLATELET  ABO/RH     EKG     RADIOLOGY  Ultrasound shows no confirmed pregnancy.  She does have a 2 cm right ovarian simple cyst.   PROCEDURES:  Critical  Care performed: No  Procedures   MEDICATIONS ORDERED IN ED: Medications - No data to display   IMPRESSION / MDM / ASSESSMENT AND PLAN / ED COURSE  I reviewed the triage vital signs and the nursing notes.                              Differential diagnosis includes, but is not limited to, vaginal bleeding in pregnancy, early pregnancy, miscarriage, ectopic pregnancy  Patient's presentation is most consistent with acute presentation with potential threat to life or bodily function.  21 year old female presenting to the emergency department for treatment and evaluation of vaginal bleeding after positive pregnancy test at home.  See HPI for further details.  Beta-hCG is elevated to 95.  Plan will be to get an ultrasound.  Ultrasound shows no gestational sac and a 2 cm simple cyst on the right ovary.  This may be early pregnancy or miscarriage.  She is not having  any pelvic pain or cramping therefore ectopic is unlikely.  Results discussed with the patient.  She was instructed to try and have her labs repeated in a couple of days.  She does not currently have a gynecologist.  Information will be provided for the on-call provider as well as the health department.  ER return precautions discussed as well.     FINAL CLINICAL IMPRESSION(S) / ED DIAGNOSES   Final diagnoses:  Vaginal bleeding in pregnancy     Rx / DC Orders   ED Discharge Orders     None        Note:  This document was prepared using Dragon voice recognition software and may include unintentional dictation errors.   Chinita Pester, FNP 09/27/22 1911    Merwyn Katos, MD 09/27/22 [phone]

## 2022-09-27 NOTE — ED Provider Triage Note (Signed)
Emergency Medicine Provider Triage Evaluation Note  Nicole Rocha , a 21 y.o. female  was evaluated in triage.  Pt complains of vaginal bleeding. LMP November 4. Positive pregnancy test at home.   Physical Exam  There were no vitals taken for this visit. Gen:   Awake, no distress   Resp:  Normal effort  MSK:   Moves extremities without difficulty  Other:    Medical Decision Making  Medically screening exam initiated at 1:14 PM.  Appropriate orders placed.  Nicole Rocha was informed that the remainder of the evaluation will be completed by another provider, this initial triage assessment does not replace that evaluation, and the importance of remaining in the ED until their evaluation is complete.  Labs then [place].   Nicole Pester, FNP 09/27/22 [phone]

## 2022-09-28 ENCOUNTER — Telehealth: Payer: Self-pay | Admitting: Family Medicine

## 2022-09-28 NOTE — Telephone Encounter (Signed)
Call to client with Marlene Yemen and left message to call [facility] to schedule follow-up appt to [facility] ED visit 09/27/2022. Per Glenna Fellows FNP-BC, schedue appt for 12/21 or 10/01/22 (will schedule acute appt). Jossie Ng, RN

## 2022-09-28 NOTE — Telephone Encounter (Signed)
Patient went to ER on 12/18. Patient stated that pregnancy was confirmed but they suspected that she was having a miscarriage. Patient stated that she was told to schedule an appointment with the Health Department to have her hormones checked in a couple of days. Per Nicholos Johns, patient needs an acute appointment. There are no appointments available until January. Nicholos Johns asked me to send a phone note so a nurse could call the patient.

## 2022-09-30 NOTE — Telephone Encounter (Signed)
Call to client regarding scheduling [facility] acute appt as follow-up to [facility] ED visit on 09/27/2022 for ? SAB.   Call to client with Marlene Yemen and per recorded message, voicemail box is full. Jossie Ng, RN

## 2022-10-07 NOTE — Telephone Encounter (Signed)
Called patient for update on her condition. Voice mail full therefore unable to leave message. Emergency Contact number called utilizing interpretor. Left message on voice mail that [facility] attempting to reach [facility] and for her to call [phone]. BThiele RN

## 2022-10-13 NOTE — Telephone Encounter (Signed)
Call to client to ascertain if follow-up from 09/27/2022 [facility] ED visit desired. Call to client with [facility] [employer]  ID # [phone] and per recorded  message, voicemail box is full. Call to emergency contact (friend) and left message requesting client call [facility] as needed. Number to call provided. Rich Number, RN

## 2023-04-18 ENCOUNTER — Emergency Department
Admission: EM | Admit: 2023-04-18 | Discharge: 2023-04-18 | Disposition: A | Discharge status: Home / Self Care | Payer: Self-pay | Attending: Emergency Medicine | Admitting: Emergency Medicine

## 2023-04-18 ENCOUNTER — Emergency Department: Payer: Self-pay

## 2023-04-18 ENCOUNTER — Other Ambulatory Visit: Payer: Self-pay

## 2023-04-18 DIAGNOSIS — R110 Nausea: Secondary | ICD-10-CM | POA: Insufficient documentation

## 2023-04-18 DIAGNOSIS — R079 Chest pain, unspecified: Secondary | ICD-10-CM | POA: Insufficient documentation

## 2023-04-18 LAB — BASIC METABOLIC PANEL
Anion gap: 8 (ref 5–15)
BUN: 17 mg/dL (ref 6–20)
CO2: 23 mmol/L (ref 22–32)
Calcium: 8.7 mg/dL — ABNORMAL LOW (ref 8.9–10.3)
Chloride: 104 mmol/L (ref 98–111)
Creatinine, Ser: 0.74 mg/dL (ref 0.44–1.00)
GFR, Estimated: >60 mL/min (ref >60)
Glucose, Bld: 108 mg/dL — ABNORMAL HIGH (ref 70–99)
Potassium: 3.3 mmol/L — ABNORMAL LOW (ref 3.5–5.1)
Sodium: 135 mmol/L (ref 135–145)

## 2023-04-18 LAB — CBC
HCT: 37.1 % (ref 36.0–46.0)
Hemoglobin: 12.5 g/dL (ref 12.0–15.0)
MCH: 28.2 pg (ref 26.0–34.0)
MCHC: 33.7 g/dL (ref 30.0–36.0)
MCV: 83.7 fL (ref 80.0–100.0)
Platelets: 302 10*3/uL (ref 150–400)
RBC: 4.43 MIL/uL (ref 3.87–5.11)
RDW: 13.2 % (ref 11.5–15.5)
WBC: 9.2 10*3/uL (ref 4.0–10.5)
nRBC: 0 % (ref 0.0–0.2)

## 2023-04-18 LAB — TROPONIN I (HIGH SENSITIVITY): Troponin I (High Sensitivity): <2 ng/L (ref <18)

## 2023-04-18 MED ORDER — IBUPROFEN 800 MG PO TABS: 800.0000 mg | ORAL_TABLET | Freq: Three times a day (TID) | ORAL | 0 refills | Status: DC | PRN

## 2023-04-18 MED ORDER — KETOROLAC TROMETHAMINE 30 MG/ML IJ SOLN: 30.0000 mg | Freq: Once | INTRAMUSCULAR | Status: DC

## 2023-04-18 MED ORDER — KETOROLAC TROMETHAMINE 30 MG/ML IJ SOLN
30.0000 mg | Freq: Once | INTRAMUSCULAR | Status: AC
Start: 2023-04-18 — End: 2023-04-18
  Administered 2023-04-18: 30 mg via INTRAMUSCULAR
  Filled 2023-04-18: qty 1

## 2023-04-18 NOTE — Discharge Instructions (Addendum)
Please go to the following website to schedule new (and existing) patient appointments:   [email or web address]   The following is a list of primary care offices in the area who are accepting new patients at this time.  Please reach out to one of them directly and let them know you would like to schedule an appointment to follow up on an Emergency Department visit, and/or to establish a new primary care provider (PCP).  There are likely other primary care clinics in the are who are accepting new patients, but this is an excellent place to start:  [facility] Lead physician: Dr Angela Bacigalupo [address] [place], [place] [address] [phone]  [facility] Lead Physician: Dr Krichna Sowles [address], , [place] [address] [phone]  [facility]  Lead Physician: Dr Megan Johnson [address], [place], [place] [address] [phone]  [facility] Lead Physician: Dr Alex Karamalegos [address], [place], [place] [address] [phone]  [facility] [place] Lead Physician: Dr Laura Berglund [address], [place], [place] [address] [phone]   

## 2023-04-18 NOTE — ED Provider Notes (Signed)
[facility] Emergency Department Provider Note     Event Date/Time   First MD Initiated Contact with Patient 04/18/23 2001     (approximate)   History   Chest Pain   HPI  Nicole Rocha is a 22 y.o. female to the ED for evaluation of chest pain that started this morning.  Patient central chest pain unable to describe that radiates to left arm down to wrist.  Denies shortness of breath.      Physical Exam   Triage Vital Signs: ED Triage Vitals  Enc Vitals Group     BP 04/18/23 1823 (!) 133/91     Pulse Rate 04/18/23 1823 92     Resp 04/18/23 1823 18     Temp 04/18/23 1823 98 F (36.7 C)     Temp src --      SpO2 04/18/23 1823 100 %     Weight --      Height --      Head Circumference --      Peak Flow --      Pain Score 04/18/23 1822 7     Pain Loc --      Pain Edu? --      Excl. in GC? --     Most recent vital signs: Vitals:   04/18/23 1823  BP: (!) 133/91  Pulse: 92  Resp: 18  Temp: 98 F (36.7 C)  SpO2: 100%    General Awake, no distress. *** {**HEENT NCAT. PERRL. EOMI. No rhinorrhea. Mucous membranes are moist. **} CV:  Good peripheral perfusion. *** RESP:  Normal effort. *** ABD:  No distention. *** {**Other: **}   ED Results / Procedures / Treatments   Labs (all labs ordered are listed, but only abnormal results are displayed) Labs Reviewed  BASIC METABOLIC PANEL - Abnormal; Notable for the following components:      Result Value   Potassium 3.3 (*)    Glucose, Bld 108 (*)    Calcium 8.7 (*)    All other components within normal limits  CBC  TROPONIN I (HIGH SENSITIVITY)    EKG  ***  RADIOLOGY  {**I personally viewed and evaluated these images as part of my medical decision making, as well as reviewing the written report by the radiologist.  ED Provider Interpretation: ***  History and physical examination do not warrant a lab work up or imaging at this time. ***  DG Chest 2 View  Result  Date: 04/18/2023 CLINICAL DATA:  Chest pain EXAM: CHEST - 2 VIEW COMPARISON:  None Available. FINDINGS: The heart size and mediastinal contours are within normal limits. Both lungs are clear. The visualized skeletal structures are unremarkable. IMPRESSION: No active cardiopulmonary disease. Electronically Signed   By: Darliss Cheney M.D.   On: 04/18/2023 19:07     PROCEDURES:  Critical Care performed: {CriticalCareYesNo:19197::"Yes, see critical care procedure note(s)","No"}  Procedures   MEDICATIONS ORDERED IN ED: Medications  ketorolac (TORADOL) 30 MG/ML injection 30 mg (has no administration in time range)     IMPRESSION / MDM / ASSESSMENT AND PLAN / ED COURSE  I reviewed the triage vital signs and the nursing notes.                                 22 y.o. female presents to the emergency department for evaluation and treatment of ***. See HPI for further details. Vital signs and  physical exam are pertinent for ***.   Differential diagnosis includes, but is not limited to ***   {**The patient is on the cardiac monitor to evaluate for evidence of arrhythmia and/or significant heart rate changes.**}  Lab work ordered and reviewed revealing ***   Imaging ordered and reviewed ***   The patient was administered *** resulting in *** of symptoms.  Patient is in satisfactory and stable condition for discharge and outpatient follow up. Patient will be discharged home with prescriptions for ***. Patient is to follow up with *** as needed or otherwise directed. Patient is given ED precautions to return to the ED for any worsening or new symptoms. Patient verbalizes understanding. All questions and concerns were addressed during ED visit.    Patient's presentation is most consistent with {EM COPA:27473}  FINAL CLINICAL IMPRESSION(S) / ED DIAGNOSES   Final diagnoses:  None     Rx / DC Orders   ED Discharge Orders     None        Note:  This document was prepared using  Dragon voice recognition software and may include unintentional dictation errors.

## 2023-04-18 NOTE — ED Provider Triage Note (Signed)
Emergency Medicine Provider Triage Evaluation Note History limited by Spanish language. Tele-interpreter used for triage.   Sherlynn Stalls, a 22 y.o. female  was evaluated in triage.  Pt complains of central chest pain and left arm pain.  Patient reports this morning her chest began she also endorses some nausea without vomiting..  Review of Systems  Positive: CP,. LUE numbness Negative: SOB  Physical Exam  BP (!) 133/91   Pulse 92   Temp 98 F (36.7 C)   Resp 18   LMP 08/14/2022   SpO2 100%  Gen:   Awake, no distress  NAD Resp:  Normal effort CTA MSK:   Moves extremities without difficulty  Other:    Medical Decision Making  Medically screening exam initiated at 7:31 PM.  Appropriate orders placed.  Kirsty Cortes-Pedroza was informed that the remainder of the evaluation will be completed by another provider, this initial triage assessment does not replace that evaluation, and the importance of remaining in the ED until their evaluation is complete.  Patient to the ED for evaluation of central chest pain and left arm numbness and tingling with onset this morning.   Lissa Hoard, PA-C 04/18/23 1935

## 2023-04-18 NOTE — ED Triage Notes (Addendum)
Pt comes with c/o cp and left arm pain. Pt states this morning her chest started hurting. Pt states numbness and pain to left arm as well. Pt states some nausea.

## 2023-06-25 ENCOUNTER — Emergency Department
Admission: EM | Admit: 2023-06-25 | Discharge: 2023-06-25 | Disposition: A | Discharge status: Home / Self Care | Payer: Self-pay | Attending: Emergency Medicine | Admitting: Emergency Medicine

## 2023-06-25 ENCOUNTER — Other Ambulatory Visit: Payer: Self-pay

## 2023-06-25 DIAGNOSIS — M545 Low back pain, unspecified: Secondary | ICD-10-CM

## 2023-06-25 DIAGNOSIS — M546 Pain in thoracic spine: Secondary | ICD-10-CM | POA: Insufficient documentation

## 2023-06-25 LAB — URINALYSIS, ROUTINE W REFLEX MICROSCOPIC
Bacteria, UA: NONE SEEN
Bilirubin Urine: NEGATIVE
Glucose, UA: NEGATIVE mg/dL
Ketones, ur: NEGATIVE mg/dL
Nitrite: NEGATIVE
Protein, ur: NEGATIVE mg/dL
Specific Gravity, Urine: 1.021 (ref 1.005–1.030)
pH: 5 (ref 5.0–8.0)

## 2023-06-25 LAB — POC URINE PREG, ED: Preg Test, Ur: NEGATIVE

## 2023-06-25 MED ORDER — BACLOFEN 10 MG PO TABS: 10.0000 mg | ORAL_TABLET | Freq: Three times a day (TID) | ORAL | 0 refills | Status: AC

## 2023-06-25 MED ORDER — MELOXICAM 15 MG PO TABS: 15.0000 mg | ORAL_TABLET | Freq: Every day | ORAL | 2 refills | Status: AC

## 2023-06-25 NOTE — ED Triage Notes (Addendum)
Pt here with back pain x4 days. Pt denies fall or injury. Pt states pain does not radiate. Pt also states she should have gotten her menstrual 2 weeks ago but it has not came yet. Pt ambulatory to triage.

## 2023-06-25 NOTE — ED Notes (Signed)
Pt A&O x4, no obvious distress noted, respirations regular/unlabored. Pt verbalizes understanding of discharge instructions. Pt able to ambulate from ED independently.   

## 2023-06-25 NOTE — ED Provider Notes (Signed)
[facility] Provider Note    Event Date/Time   First MD Initiated Contact with Patient 06/25/23 1127     (approximate)   History   Back Pain   HPI  Nicole Rocha is a 22 y.o. female with no significant past medical history presents emergency department with 4 days of low back pain.  States feels like a muscle is spasming.  No vaginal discharge.  Is late for her menstrual cycle.  No vomiting.  No known injury to the lower back.      Physical Exam   Triage Vital Signs: ED Triage Vitals [06/25/23 0956]  Encounter Vitals Group     BP 119/79     Systolic BP Percentile      Diastolic BP Percentile      Pulse Rate 94     Resp 16     Temp 98.7 F (37.1 C)     Temp Source Oral     SpO2 99 %     Weight 204 lb 12.9 oz (92.9 kg)     Height 5\' 7"  (1.702 m)     Head Circumference      Peak Flow      Pain Score      Pain Loc      Pain Education      Exclude from Growth Chart     Most recent vital signs: Vitals:   06/25/23 0956  BP: 119/79  Pulse: 94  Resp: 16  Temp: 98.7 F (37.1 C)  SpO2: 99%     General: Awake, no distress.   CV:  Good peripheral perfusion. regular rate and  rhythm Resp:  Normal effort.  Abd:  No distention.   Other:  Lumbar spine is nontender, paravertebral muscles are spasmed, 5 or 5 strength lower extremity, neurovascular intact   ED Results / Procedures / Treatments   Labs (all labs ordered are listed, but only abnormal results are displayed) Labs Reviewed  URINALYSIS, ROUTINE W REFLEX MICROSCOPIC - Abnormal; Notable for the following components:      Result Value   Color, Urine YELLOW (*)    APPearance CLEAR (*)    Hgb urine dipstick SMALL (*)    Leukocytes,Ua TRACE (*)    All other components within normal limits  POC URINE PREG, ED - Normal     EKG     RADIOLOGY     PROCEDURES:   Procedures   MEDICATIONS ORDERED IN ED: Medications - No data to display   IMPRESSION / MDM /  ASSESSMENT AND PLAN / ED COURSE  I reviewed the triage vital signs and the nursing notes.                              Differential diagnosis includes, but is not limited to, lumbar strain, pregnancy, UTI  Patient's presentation is most consistent with acute illness / injury with system symptoms.   X-ray was deferred by the patient at this time is feel it is more muscular  POC pregnancy and urinalysis are reassuring  I did explain the findings to the patient.  She was given a prescription for meloxicam and baclofen.  She is apply ice to the lower back.  Follow-up with her regular doctor if not improving in 3 days.  Return emergency department if worsening.  Patient is in agreement treatment plan.  Discharged stable condition.  All information and discharge structures were discussed via the video  interpreter      FINAL CLINICAL IMPRESSION(S) / ED DIAGNOSES   Final diagnoses:  Acute midline low back pain without sciatica     Rx / DC Orders   ED Discharge Orders          Ordered    meloxicam (MOBIC) 15 MG tablet  Daily        06/25/23 1145    baclofen (LIORESAL) 10 MG tablet  3 times daily        06/25/23 1145             Note:  This document was prepared using Dragon voice recognition software and may include unintentional dictation errors.    Faythe Ghee, PA-C 06/25/23 1300    Sharyn Creamer, MD 06/25/23 2039

## 2024-07-29 ENCOUNTER — Other Ambulatory Visit: Payer: Self-pay

## 2024-07-29 ENCOUNTER — Emergency Department: Payer: Self-pay

## 2024-07-29 ENCOUNTER — Emergency Department
Admission: EM | Admit: 2024-07-29 | Discharge: 2024-07-29 | Disposition: A | Discharge status: Home / Self Care | Payer: Self-pay | Attending: Emergency Medicine | Admitting: Emergency Medicine

## 2024-07-29 DIAGNOSIS — O209 Hemorrhage in early pregnancy, unspecified: Secondary | ICD-10-CM | POA: Insufficient documentation

## 2024-07-29 DIAGNOSIS — Z3A01 Less than 8 weeks gestation of pregnancy: Secondary | ICD-10-CM | POA: Insufficient documentation

## 2024-07-29 DIAGNOSIS — O99891 Other specified diseases and conditions complicating pregnancy: Secondary | ICD-10-CM | POA: Insufficient documentation

## 2024-07-29 DIAGNOSIS — O4690 Antepartum hemorrhage, unspecified, unspecified trimester: Secondary | ICD-10-CM

## 2024-07-29 DIAGNOSIS — R8271 Bacteriuria: Secondary | ICD-10-CM | POA: Insufficient documentation

## 2024-07-29 LAB — BASIC METABOLIC PANEL WITH GFR
Anion gap: 12 (ref 5–15)
BUN: 19 mg/dL (ref 6–20)
CO2: 21 mmol/L — ABNORMAL LOW (ref 22–32)
Calcium: 9.2 mg/dL (ref 8.9–10.3)
Chloride: 102 mmol/L (ref 98–111)
Creatinine, Ser: 0.94 mg/dL (ref 0.44–1.00)
GFR, Estimated: >60 mL/min (ref >60)
Glucose, Bld: 98 mg/dL (ref 70–99)
Potassium: 3.7 mmol/L (ref 3.5–5.1)
Sodium: 135 mmol/L (ref 135–145)

## 2024-07-29 LAB — CBC WITH DIFFERENTIAL/PLATELET
Abs Immature Granulocytes: 0.05 K/uL (ref 0.00–0.07)
Basophils Absolute: 0 K/uL (ref 0.0–0.1)
Basophils Relative: 1 %
Eosinophils Absolute: 0.1 K/uL (ref 0.0–0.5)
Eosinophils Relative: 1 %
HCT: 38.8 % (ref 36.0–46.0)
Hemoglobin: 13.2 g/dL (ref 12.0–15.0)
Immature Granulocytes: 1 %
Lymphocytes Relative: 23 %
Lymphs Abs: 1.8 K/uL (ref 0.7–4.0)
MCH: 28.6 pg (ref 26.0–34.0)
MCHC: 34 g/dL (ref 30.0–36.0)
MCV: 84.2 fL (ref 80.0–100.0)
Monocytes Absolute: 0.5 K/uL (ref 0.1–1.0)
Monocytes Relative: 6 %
Neutro Abs: 5.5 K/uL (ref 1.7–7.7)
Neutrophils Relative %: 68 %
Platelets: 339 K/uL (ref 150–400)
RBC: 4.61 MIL/uL (ref 3.87–5.11)
RDW: 12.9 % (ref 11.5–15.5)
WBC: 8 K/uL (ref 4.0–10.5)
nRBC: 0 % (ref 0.0–0.2)

## 2024-07-29 LAB — URINALYSIS, ROUTINE W REFLEX MICROSCOPIC
Bilirubin Urine: NEGATIVE
Glucose, UA: NEGATIVE mg/dL
Hgb urine dipstick: NEGATIVE
Ketones, ur: NEGATIVE mg/dL
Nitrite: NEGATIVE
Protein, ur: NEGATIVE mg/dL
Specific Gravity, Urine: 1.015 (ref 1.005–1.030)
pH: 6 (ref 5.0–8.0)

## 2024-07-29 LAB — POC URINE PREG, ED: Preg Test, Ur: POSITIVE — AB

## 2024-07-29 LAB — HCG, QUANTITATIVE, PREGNANCY: hCG, Beta Chain, Quant, S: 28879 m[IU]/mL — ABNORMAL HIGH (ref <5)

## 2024-07-29 MED ORDER — CEPHALEXIN 500 MG PO CAPS
500.0000 mg | ORAL_CAPSULE | Freq: Once | ORAL | Status: AC
  Administered 2024-07-29: 500 mg via ORAL
  Filled 2024-07-29: qty 1

## 2024-07-29 MED ORDER — ONDANSETRON 4 MG PO TBDP
4.0000 mg | ORAL_TABLET | Freq: Once | ORAL | Status: AC
  Administered 2024-07-29: 4 mg via ORAL
  Filled 2024-07-29: qty 1

## 2024-07-29 MED ORDER — ONDANSETRON 4 MG PO TBDP: 4.0000 mg | ORAL_TABLET | Freq: Three times a day (TID) | ORAL | 0 refills | Status: AC | PRN

## 2024-07-29 MED ORDER — CEPHALEXIN 500 MG PO CAPS: 500.0000 mg | ORAL_CAPSULE | Freq: Four times a day (QID) | ORAL | 0 refills | Status: AC

## 2024-07-29 NOTE — ED Notes (Signed)
 Pt states she had 2x episodes of light bleeding. Denies any more bleeding or cramping at this time.

## 2024-07-29 NOTE — ED Provider Notes (Signed)
 [facility] Emergency Department Provider Note     Event Date/Time   First MD Initiated Contact with Patient 07/29/24 1954     (approximate)   History   Vaginal Bleeding   HPI  History limited by Spanish language. Tele-interpreter [phone]) used for interview and exam.   Nicole Rocha is a 23 y.o. female G4, P1 at 11 weeks 6 days gestation based on her LMP of 04/29/2024.  She presents to the ED with 2 days of intermittent vaginal bleeding.  She describes bright red blood that has been scant and describes as spotting.  She denies any cramping, abdominal pain, nausea, vomiting, or dysuria.  Physical Exam   Triage Vital Signs: ED Triage Vitals  Encounter Vitals Group     BP 07/29/24 1932 124/84     Girls Systolic BP Percentile --      Girls Diastolic BP Percentile --      Boys Systolic BP Percentile --      Boys Diastolic BP Percentile --      Pulse Rate 07/29/24 1932 87     Resp 07/29/24 1932 18     Temp 07/29/24 1932 98.4 F (36.9 C)     Temp Source 07/29/24 1932 Oral     SpO2 07/29/24 1932 98 %     Weight 07/29/24 1936 210 lb (95.3 kg)     Height 07/29/24 1936 5' 7 (1.702 m)     Head Circumference --      Peak Flow --      Pain Score 07/29/24 1936 0     Pain Loc --      Pain Education --      Exclude from Growth Chart --     Most recent vital signs: Vitals:   07/29/24 1932  BP: 124/84  Pulse: 87  Resp: 18  Temp: 98.4 F (36.9 C)  SpO2: 98%    General Awake, no distress. NAD HEENT NCAT. PERRL. EOMI. No rhinorrhea. Mucous membranes are moist.  CV:  Good peripheral perfusion.  RESP:  Normal effort.  ABD:  No distention.  GYN:  Deferred     ED Results / Procedures / Treatments   Labs (all labs ordered are listed, but only abnormal results are displayed) Labs Reviewed  URINALYSIS, ROUTINE W REFLEX MICROSCOPIC - Abnormal; Notable for the following components:      Result Value   Color, Urine YELLOW (*)     APPearance CLEAR (*)    Leukocytes,Ua SMALL (*)    Bacteria, UA RARE (*)    All other components within normal limits  HCG, QUANTITATIVE, PREGNANCY - Abnormal; Notable for the following components:   hCG, Beta Chain, Quant, S 28,879 (*)    All other components within normal limits  BASIC METABOLIC PANEL WITH GFR - Abnormal; Notable for the following components:   CO2 21 (*)    All other components within normal limits  POC URINE PREG, ED - Abnormal; Notable for the following components:   Preg Test, Ur Positive (*)    All other components within normal limits  URINE CULTURE  CBC WITH DIFFERENTIAL/PLATELET     EKG   RADIOLOGY  I personally viewed and evaluated these images as part of my medical decision making, as well as reviewing the written report by the radiologist.  ED Provider Interpretation: Single IUP without evidence of subchorionic hemorrhage  US  OB LESS THAN 14 WEEKS WITH OB TRANSVAGINAL Result Date: 07/29/2024 CLINICAL DATA:  Pelvic pain, vaginal  bleeding, positive urine pregnancy test EXAM: OBSTETRIC <14 WK US  AND TRANSVAGINAL OB US  TECHNIQUE: Both transabdominal and transvaginal ultrasound examinations were performed for complete evaluation of the gestation as well as the maternal uterus, adnexal regions, and pelvic cul-de-sac. Transvaginal technique was performed to assess early pregnancy. COMPARISON:  None Available. FINDINGS: Intrauterine gestational sac: Single Yolk sac:  Visualized. Embryo:  Visualized. Cardiac Activity: Visualized. Heart Rate: 117 bpm CRL:  4.7 mm   6 w   1 d                  US  EDC: 03/23/2025 Subchorionic hemorrhage:  None visualized. Maternal uterus/adnexae: Right ovary measures 2.8 x 1.3 x 2.4 cm. Left ovary measures 3.5 x 2.6 x 2.7 cm, with corpus luteal cyst seen. No pelvic free fluid. IMPRESSION: 1. Single live intrauterine pregnancy as above, estimated age 36 weeks and 1 day. 2. Probable left adnexal corpus luteal cyst. Electronically Signed    By: Ozell Daring M.D.   On: 07/29/2024 20:49     PROCEDURES:  Critical Care performed: No  Procedures   MEDICATIONS ORDERED IN ED: Medications  ondansetron  (ZOFRAN -ODT) disintegrating tablet 4 mg (has no administration in time range)  cephALEXin (KEFLEX) capsule 500 mg (has no administration in time range)     IMPRESSION / MDM / ASSESSMENT AND PLAN / ED COURSE  I reviewed the triage vital signs and the nursing notes.                              Differential diagnosis includes, but is not limited to, threatened miscarriage, incomplete miscarriage, normal bleeding from an early trimester pregnancy, ectopic pregnancy, , blighted ovum, vaginal/cervical trauma, subchorionic hemorrhage/hematoma, etc.  Patient's presentation is most consistent with acute complicated illness / injury requiring diagnostic workup.  Patient's diagnosis is consistent with single IUP with noted fetal pole and heart activity..  Patient presenting with some scant spotting on the toilet tissue for the last 24 hours.  Exam is overall reassuring.  Patient deferred vaginal exam on this presentation.  Ultrasound interpreted by me, shows a single IUP with noted cardiac activity.  No evidence of a subchorionic hemorrhage.  Labs overall reassuring with a robust beta-hCG at greater than 20.  And cultures pending at this time.  Patient will be discharged home with prescriptions for Zofran  and Keflex. Patient is to follow up with [facility] as her primary OB provider, as discussed, as needed or otherwise directed. Patient is given ED precautions to return to the ED for any worsening or new symptoms.   FINAL CLINICAL IMPRESSION(S) / ED DIAGNOSES   Final diagnoses:  Vaginal bleeding in pregnancy  Bacteriuria during pregnancy in first trimester     Rx / DC Orders   ED Discharge Orders          Ordered    cephALEXin (KEFLEX) 500 MG capsule  4 times daily        07/29/24 2138    ondansetron  (ZOFRAN -ODT) 4 MG  disintegrating tablet  Every 8 hours PRN        07/29/24 2138             Note:  This document was prepared using Dragon voice recognition software and may include unintentional dictation errors.    Loyd Candida LULLA Aldona, PA-C 07/29/24 2142    Jacolyn Pae, MD 07/29/24 2312

## 2024-07-29 NOTE — ED Triage Notes (Signed)
 Pt presented to ED POV with c/o  vaginal bleeding x 2 episodes starting yesterday. Pt endorse spotting bright red blood. [redacted] weeks pregnant. Denies cramping, denies abd pain. G4P1

## 2024-07-29 NOTE — Discharge Instructions (Addendum)
 Your exam, labs, and ultrasound are overall normal and reassuring.  It shows a developing pregnancy in the uterus which is expected.  No signs of any serious bleeding related to the pregnancy.  You are being treated with antibiotic for us  trace amount of bacteria in your urine.  Take antibiotic as prescribed take nausea medicine as needed.  Follow-up with [facility] Department for routine prenatal care as discussed.  Return to the ED for any worsening heavy bleeding, cramping, or pelvic pain.  Su examen, laboratorio y [place] son en general normales y tranquilizadores.  Muestra un embarazo en desarrollo en el tero que se espera.  No hay signos de sangrado grave relacionado con el embarazo.  Est siendo tratado con antibiticos porque tenemos trazas de bacterias en la orina.  Tome el antibitico segn lo prescrito y tome el medicamento para las nuseas segn sea necesario.  Seguimiento con Engineer, manufacturing systems de [place] de  para la atencin prenatal de rutina como se discuti.  Regrese al servicio de urgencias si empeora el sangrado abundante, los calambres o el dolor plvico.

## 2024-07-31 LAB — URINE CULTURE: Special Requests: NORMAL

## 2024-08-12 ENCOUNTER — Other Ambulatory Visit: Payer: Self-pay

## 2024-08-12 ENCOUNTER — Emergency Department: Payer: Self-pay

## 2024-08-12 ENCOUNTER — Emergency Department
Admission: EM | Admit: 2024-08-12 | Discharge: 2024-08-12 | Disposition: A | Discharge status: Home / Self Care | Payer: Self-pay | Attending: Emergency Medicine | Admitting: Emergency Medicine

## 2024-08-12 DIAGNOSIS — Z3A08 8 weeks gestation of pregnancy: Secondary | ICD-10-CM | POA: Insufficient documentation

## 2024-08-12 DIAGNOSIS — O99411 Diseases of the circulatory system complicating pregnancy, first trimester: Secondary | ICD-10-CM | POA: Insufficient documentation

## 2024-08-12 DIAGNOSIS — I4510 Unspecified right bundle-branch block: Secondary | ICD-10-CM | POA: Insufficient documentation

## 2024-08-12 DIAGNOSIS — R0602 Shortness of breath: Secondary | ICD-10-CM

## 2024-08-12 LAB — CBC
HCT: 35.8 % — ABNORMAL LOW (ref 36.0–46.0)
Hemoglobin: 12.3 g/dL (ref 12.0–15.0)
MCH: 28.3 pg (ref 26.0–34.0)
MCHC: 34.4 g/dL (ref 30.0–36.0)
MCV: 82.3 fL (ref 80.0–100.0)
Platelets: 307 K/uL (ref 150–400)
RBC: 4.35 MIL/uL (ref 3.87–5.11)
RDW: 12.6 % (ref 11.5–15.5)
WBC: 6.8 K/uL (ref 4.0–10.5)
nRBC: 0 % (ref 0.0–0.2)

## 2024-08-12 LAB — BASIC METABOLIC PANEL WITH GFR
Anion gap: 11 (ref 5–15)
BUN: 13 mg/dL (ref 6–20)
CO2: 23 mmol/L (ref 22–32)
Calcium: 9.1 mg/dL (ref 8.9–10.3)
Chloride: 101 mmol/L (ref 98–111)
Creatinine, Ser: 0.54 mg/dL (ref 0.44–1.00)
GFR, Estimated: >60 mL/min (ref >60)
Glucose, Bld: 111 mg/dL — ABNORMAL HIGH (ref 70–99)
Potassium: 3.4 mmol/L — ABNORMAL LOW (ref 3.5–5.1)
Sodium: 135 mmol/L (ref 135–145)

## 2024-08-12 LAB — TROPONIN I (HIGH SENSITIVITY)
Troponin I (High Sensitivity): <2 ng/L (ref <18)
Troponin I (High Sensitivity): <2 ng/L (ref <18)

## 2024-08-12 LAB — D-DIMER, QUANTITATIVE: D-Dimer, Quant: 0.6 ug{FEU}/mL — ABNORMAL HIGH (ref 0.00–0.50)

## 2024-08-12 NOTE — ED Notes (Signed)
 Delay in EKG was because pt was triaged at desk in lobby, then no triage rooms were available for EKG.   Blue top sent to lab with other labs.

## 2024-08-12 NOTE — ED Provider Notes (Signed)
 [facility] Provider Note    Event Date/Time   First MD Initiated Contact with Patient 08/12/24 1533     (approximate)   History   Shortness of Breath and Chest Pain   HPI  Nicole Rocha is a 23 y.o. female with PMH of appendectomy, who presents for evaluation of shortness of breath and chest tightness.  Patient symptoms began yesterday while she was at the store.  States that it came on all of a sudden and she felt like she was having a difficult time talking.  She also felt dizzy at this point.  Her symptoms have persisted to today.  She is not sure if the shortness of breath or chest pain is worse with exertion.  She denies pain when taking deep breaths.  She endorses a headache but no other URI symptoms like cough, congestion or fever.  No calf pain, calf swelling or hemoptysis.  Patient believes she is about [redacted] weeks pregnant.  She states she does not smoke cigarettes but did use to vape but that has been a while since she has vaped.      Physical Exam   Triage Vital Signs: ED Triage Vitals  Encounter Vitals Group     BP 08/12/24 1351 120/68     Girls Systolic BP Percentile --      Girls Diastolic BP Percentile --      Boys Systolic BP Percentile --      Boys Diastolic BP Percentile --      Pulse Rate 08/12/24 1351 91     Resp 08/12/24 1351 18     Temp 08/12/24 1351 98 F (36.7 C)     Temp Source 08/12/24 1351 Oral     SpO2 08/12/24 1351 100 %     Weight 08/12/24 1349 210 lb (95.3 kg)     Height 08/12/24 1349 5' 7 (1.702 m)     Head Circumference --      Peak Flow --      Pain Score 08/12/24 1348 5     Pain Loc --      Pain Education --      Exclude from Growth Chart --     Most recent vital signs: Vitals:   08/12/24 1546 08/12/24 1807  BP:  114/66  Pulse:  90  Resp:  16  Temp:  98 F (36.7 C)  SpO2: 100% 100%   General: Awake, no distress.  CV:  Good peripheral perfusion.  RRR. Resp:  Normal effort.  CTAB. Abd:  No  distention.  Other:     ED Results / Procedures / Treatments   Labs (all labs ordered are listed, but only abnormal results are displayed) Labs Reviewed  BASIC METABOLIC PANEL WITH GFR - Abnormal; Notable for the following components:      Result Value   Potassium 3.4 (*)    Glucose, Bld 111 (*)    All other components within normal limits  CBC - Abnormal; Notable for the following components:   HCT 35.8 (*)    All other components within normal limits  D-DIMER, QUANTITATIVE - Abnormal; Notable for the following components:   D-Dimer, Quant 0.60 (*)    All other components within normal limits  POC URINE PREG, ED  TROPONIN I (HIGH SENSITIVITY)  TROPONIN I (HIGH SENSITIVITY)     EKG  ED provider interpretation: Normal sinus rhythm with right bundle branch block  Vent. rate 87 BPM PR interval 140 ms QRS duration 132  ms QT/QTcB 372/447 ms P-R-T axes 71 81 35   RADIOLOGY  Chest x-ray obtained, interpreted the images as well as reviewed the radiologist report which was negative for any acute abnormalities.   PROCEDURES:  Critical Care performed: No  Procedures   MEDICATIONS ORDERED IN ED: Medications - No data to display   IMPRESSION / MDM / ASSESSMENT AND PLAN / ED COURSE  I reviewed the triage vital signs and the nursing notes.                             23 year old female presents for evaluation of shortness of breath and chest tightness.  Vital signs are stable patient NAD on exam.  Differential includes, but is not limited to, viral syndrome, bronchitis including COPD exacerbation, pneumonia, reactive airway disease including asthma, CHF including exacerbation with or without pulmonary/interstitial edema, pneumothorax, ACS, thoracic trauma, and pulmonary embolism.  Patient's presentation is most consistent with acute complicated illness / injury requiring diagnostic workup.  CBC is unremarkable.  BMP shows mild hypokalemia otherwise unremarkable.  First  troponin is not elevated.  X-rays negative for acute abnormalities.  EKG shows normal sinus rhythm with right bundle branch block, which is seen on previous EKGs but I will place a referral for cardiology.  Patient does not report symptoms consistent with a viral syndrome or bronchitis or pneumonia as she has not had any cough, congestion or fever.  Low suspicion for reactive airway disease as she does not have a history of this.  Do not suspect CHF exacerbation. No history of thoracic trauma.  Low suspicion for ACS given no ST changes on EKG and negative troponin's.  Given that patient is pregnant, will plan to obtain a D-dimer.  If elevated can consider CTA to rule out PE.  Patient stated that she had similar symptoms with her last pregnancy and nothing ever came of it. I suspect this is what is happening today.   Based on the years algorithm and PE as not to my most likely diagnosis, and a corresponding D-dimer of 0.6 we can exclude a PE.  CTA was offered to the patient.  She was educated on the potential but minimal risks to her baby and she elected to hold off on imaging for now.  She would prefer to return to the ED if symptoms get worse.  Patient was educated on return precautions.  She voiced understanding, all questions were answered and she was stable at discharge.     FINAL CLINICAL IMPRESSION(S) / ED DIAGNOSES   Final diagnoses:  Shortness of breath  Right bundle branch block     Rx / DC Orders   ED Discharge Orders          Ordered    Ambulatory referral to Cardiology       Comments: If you have not heard from the Cardiology office within the next 72 hours please call [phone].   08/12/24 1909             Note:  This document was prepared using Dragon voice recognition software and may include unintentional dictation errors.   Cleaster Tinnie LABOR, PA-C 08/12/24 1914    Willo Dunnings, MD 08/12/24 1924

## 2024-08-12 NOTE — ED Triage Notes (Signed)
 Pt to ED for chest tightness and SOB since yesterday. Respirations are unlabored. Denies hx asthma. States feels more SOB when she talks. Is talking in full sentences. Pt is [redacted] weeks pregnant. Spanish speaking.

## 2024-08-12 NOTE — Discharge Instructions (Addendum)
 Los [place] de sus anlisis de sangre de hoy fueron tranquilizadores. Su dmero D estaba ligeramente elevado, lo cual a veces puede indicar la presencia de un cogulo, pero no es especfico. Usted decidi no realizarse una tomografa computarizada hoy, la cual descartara definitivamente un cogulo. Esto es higher education careers adviser, considerando los [place] tranquilizadores de sus anlisis y signos vitales. Por favor, regrese al servicio de urgencias si presenta algn empeoramiento de los sntomas, como dificultad para respirar o journalist, newspaper.   Haga un seguimiento con su gineclogo o con scientist, clinical (histocompatibility and immunogenetics).  Su electrocardiograma, que registra la actividad elctrica de su corazn, mostr un bloqueo de rama derecha. Esto coincide con su electrocardiograma anterior, por lo que probablemente no sea motivo de preocupacin. Le he solicitado una cita con un cardilogo, un especialista en el corazn, para que pueda realizar una evaluacin ms exhaustiva.

## 2024-08-31 ENCOUNTER — Ambulatory Visit: Payer: Self-pay

## 2024-08-31 ENCOUNTER — Encounter: Payer: Self-pay | Admitting: Family Medicine

## 2024-08-31 ENCOUNTER — Ambulatory Visit: Payer: Self-pay | Admitting: Family Medicine

## 2024-08-31 VITALS — BP 101/71 | HR 92 | Temp 97.1°F | Wt 199.4 lb

## 2024-08-31 DIAGNOSIS — O99210 Obesity complicating pregnancy, unspecified trimester: Secondary | ICD-10-CM | POA: Insufficient documentation

## 2024-08-31 DIAGNOSIS — O99810 Abnormal glucose complicating pregnancy: Secondary | ICD-10-CM

## 2024-08-31 DIAGNOSIS — I454 Nonspecific intraventricular block: Secondary | ICD-10-CM

## 2024-08-31 DIAGNOSIS — O0991 Supervision of high risk pregnancy, unspecified, first trimester: Secondary | ICD-10-CM | POA: Diagnosis not present

## 2024-08-31 DIAGNOSIS — Z3A10 10 weeks gestation of pregnancy: Secondary | ICD-10-CM | POA: Diagnosis not present

## 2024-08-31 DIAGNOSIS — O209 Hemorrhage in early pregnancy, unspecified: Secondary | ICD-10-CM

## 2024-08-31 DIAGNOSIS — A749 Chlamydial infection, unspecified: Secondary | ICD-10-CM

## 2024-08-31 DIAGNOSIS — O99211 Obesity complicating pregnancy, first trimester: Secondary | ICD-10-CM

## 2024-08-31 DIAGNOSIS — O0990 Supervision of high risk pregnancy, unspecified, unspecified trimester: Secondary | ICD-10-CM

## 2024-08-31 LAB — HEMOGLOBIN, FINGERSTICK: Hemoglobin: 11.9 g/dL (ref 11.1–15.9)

## 2024-08-31 MED ORDER — ASPIRIN 81 MG PO TBEC: 81.0000 mg | DELAYED_RELEASE_TABLET | Freq: Every day | ORAL | Status: AC

## 2024-08-31 NOTE — Progress Notes (Signed)
 [facility] [address] [place] [address] Main phone: [phone]  Initial Prenatal Visit  Subjective:  Nicole Rocha is a 23 y.o. [id number] at [redacted]w[redacted]d being seen today to start prenatal care at the [facility]. The following medical issues will be considered in the care of this high-risk pregnancy:   Patient Active Problem List   Diagnosis Date Noted   Supervision of high risk pregnancy, antepartum 08/31/2024   Obesity in pregnancy= Pregravid BMI: 36.5 08/31/2024   Bundle branch block 08/31/2024   Vaginal bleeding in pregnancy, first trimester 08/31/2024   Obesity BMI=32.4 09/08/2022   Vapes nicotine containing substance 09/08/2022   Acute appendicitis 09/14/2021   Abnormal Pap smear of cervix 11/26/20 LSIL 03/11/2021   Patient reports Intermittent vomiting with meals sometimes able to tolerate PO liquids and food, hasn't taken anything for it she is fine without medication. Has been having shortness of breath since she her hospital visit on  08/12/24.  SABRA  Pt Denies leaking of fluid.   HPI:  ED visit 10/19: Vaginal Bleeding; US  shows- single IUP with noted fetal pole and heart activity. No evidence of subchorionic hemorrhage. Reassuring exam. Pending culture DC with Zofran  and Keflex .  ED visit 08/12/24: Dyspnea on exertion with chest tightness Chest x-ray  unremarkable and troponin labs were negative.  EKG showed right bundle branch block, no ischemic changes. Low suspicion for PE D-dimer mildly elevated at 0.6; Was offered CTA imaging, patient would like to hold off on CTA for now.    Symptom review and family concerns Any questions or concerns today: No Nausea or vomiting: Yes, able to tolerate food and water.  Pelvic pain: no Vaginal bleeding: no How do you feel about being pregnant: Well, I like being pregnant. Was pregnancy planned: Planned, you can say  so  Dating LMP: 05/04/24 Reliable? No Any US  performed already? Yes  Past history   Surgical history: Appendix Removal ( October-November 2022) Tobacco: No ETOH: No Drugs: No  Indications for ASA therapy One of the following: Previous pregnancy with preeclampsia, especially early onset and with an adverse outcome No  Multifetal gestation No  Chronic hypertension No  Type 1 or 2 diabetes mellitus No  Chronic kidney disease No  Autoimmune disease (antiphospholipid syndrome, systemic lupus erythematosus) No   Two or more of the following: Nulliparity  No  Obesity (body mass index >30 kg/m2) Yes  Family history of preeclampsia in mother or sister No  Age >=35 years No  Sociodemographic characteristics (African American race, low socioeconomic level) Yes  Personal risk factors (eg, previous pregnancy with low birth weight or small for gestational age infant, previous adverse pregnancy outcome [eg, stillbirth], interval >10 years between pregnancies) No    Dentist: Never Last Pap: 01/18/22 NILM, next pap due 2026.   The following portions of the patient's history were reviewed and updated as appropriate: allergies, current medications, past family history, past medical history, past social history, past surgical history and problem list. Problem list updated.  Objective:   Vitals:   08/31/24 0908  BP: 101/71  Pulse: 92  Temp: (!) 97.1 F (36.2 C)  Weight: 199 lb 6.4 oz (90.4 kg)   Fetal Status: Fetal Heart Rate (bpm):  (Utilized butterfly Bedside US , fetal cardiac activity visualized.) Fundal Height:  (Unable to assess fundal height, bimanual exam declined by pt.)        Physical Exam Vitals and nursing note reviewed. Exam conducted with a  chaperone present Lavonia T, Interperter).  Constitutional:      General: She is not in acute distress.    Appearance: Normal appearance. She is well-developed. She is obese.  HENT:     Head: Normocephalic and atraumatic.     Nose:  Nose normal. No congestion or rhinorrhea.     Mouth/Throat:     Lips: Pink.     Mouth: Mucous membranes are moist.     Dentition: Normal dentition. No dental caries.     Pharynx: Oropharynx is clear. Uvula midline.     Comments: Dentition: Good dentition, no missing teeth, no caries noted. Eyes:     General: No scleral icterus.    Conjunctiva/sclera: Conjunctivae normal.  Neck:     Thyroid : No thyroid  mass or thyromegaly.  Cardiovascular:     Rate and Rhythm: Normal rate and regular rhythm.     Pulses: Normal pulses.     Heart sounds: Normal heart sounds.     Comments: Extremities are warm and well perfused Pulmonary:     Effort: Pulmonary effort is normal.     Breath sounds: Normal breath sounds.  Chest:     Chest wall: No mass.  Breasts:    Tanner Score is 5.     Breasts are symmetrical.     Right: Normal. No mass, nipple discharge or skin change.     Left: Normal. No mass, nipple discharge or skin change.  Abdominal:     General: Abdomen is flat.     Palpations: Abdomen is soft.     Tenderness: There is no abdominal tenderness. There is no guarding.  Genitourinary:    Adnexa: Right adnexa normal and left adnexa normal.     Comments: Pt declined Pelvic Examination  Musculoskeletal:        General: Normal range of motion.     Cervical back: Normal range of motion.     Right lower leg: No edema.     Left lower leg: No edema.  Lymphadenopathy:     Cervical: No cervical adenopathy.     Upper Body:     Right upper body: No supraclavicular or axillary adenopathy.     Left upper body: No supraclavicular or axillary adenopathy.  Skin:    General: Skin is warm.     Capillary Refill: Capillary refill takes less than 2 seconds.  Neurological:     General: No focal deficit present.     Mental Status: She is alert and oriented to person, place, and time.  Psychiatric:        Mood and Affect: Mood normal.        Behavior: Behavior normal.        Thought Content: Thought  content normal.     Assessment and Plan:  Pregnancy: G5P1031 at [redacted]w[redacted]d 1. [redacted] weeks gestation of pregnancy (Primary) -RV in 4 weeks.   2. Supervision of high risk pregnancy, antepartum -EPDS:4 , [facility] forms filled out, no concerns present.  -TWG: .-10 lb 9.6 oz (-4.808 kg)  Continues to take PNV. -Recommended routine dental exam during pregnancy, pt agreeable placed [facility]  -Recommended low-dose ASA for preecampsia prevention to start at ~12 weeks, pt agreed. Provided in clinic.   - Prenatal Profile I - Inheritest 14-Gene Panel - MaterniT21 PLUS Core+ESS+SCA - Prenatal Vit-Fe Fumarate-FA (MULTIVITAMIN-PRENATAL) 27-0.8 MG TABS tablet; Take 1 tablet by mouth daily at 12 noon. - Hemoglobin, venipuncture  3. Obesity in pregnancy= Pregravid BMI: 36.5 -MNT offered,  pt  declined.  -Reviewed with pt benefits of regular moderate intensity exercise 20-30 minutes per day and a balance diet to support healthy pregnancy weight gain. Will continue to monitor weight and provided ongoing counseling as needed for maternal and fetal health.    - Glucose, 1 hour - Comprehensive metabolic panel with GFR - Hgb J8r w/o eAG - TSH - Protein / creatinine ratio, urine - aspirin  EC 81 MG tablet; Take 1 tablet (81 mg total) by mouth daily. Swallow whole.  4. Bundle branch block -BP 101/71 WNL. -MFM referral placed for further evaluation measures. -Cardiology appointment on 09/03/24, encouraged to keep appt. -Denies chest pain, tightness, or palpitations during visit today. -Given ED precautions if she develops worsening symptoms such as chest pain, shortness of breath, chest tightness, or palpations.   - AMB referral to maternal fetal medicine  5. Vaginal bleeding in pregnancy, first trimester -No vaginal bleeding since ED visit on 07/29/24.     Discussed overview of care and coordination with inpatient delivery practices including [facility],  [facility].   Centering pregnancy as standard of care at [facility] not reviewed at this visit, will defer to next visit.   Preterm labor symptoms and general obstetric precautions including but not limited to vaginal bleeding, contractions, leaking of fluid and fetal movement were reviewed in detail with the patient.  Please refer to After Visit Summary for other counseling recommendations.   Return in about 4 weeks (around 09/28/2024) for routine prenatal care.  Future Appointments  Date Time Provider Department Center  09/03/2024 10:00 AM Perla Evalene PARAS, MD [facility] None  09/28/2024  3:30 PM [facility] PROVIDER [facility] None    Olga Seyler GORMAN Pouch, NP  Attestation of Supervision of Advanced Practitioner (CNM/PA/NP): Evaluation and management procedures were performed by the Advanced Practice Provider under my supervision and collaboration.  I have reviewed the Advanced Practice Provider's note and chart, and I agree with the management and plan. I have also made any necessary editorial changes.   I was working along side this practitioner all day and all medical plans were discussed with me.   Verneta Bers, [place]

## 2024-08-31 NOTE — Progress Notes (Addendum)
 Presents for initiation of prenatal care. [facility] ED evaluation 07/29/24 for 2 day hx of vaginal bleeding. UPT positive at this appt. Per US , EGA 6 1/7. 08/12/24 [facility] ED evaluation for SOB and CXR without acute process per radiologist. Client referred to [facility] at [facility] from this visit and aware of 09/03/24 1000 appt. CenteringPrenancy not discussed today as potential for transfer of care. Centering sheet completed and above information noted for Leita Kleine, Centering Coordinator. Verified all demographic and contact information. [place]  (plans to apply for Medicaid) and lived in [place] from age 56 years until [place]. 11/26/20 had negative Quantiferon TB Gold test and denies international travel since that time (2 trips to [place] prior to 2022). Per client, has signed up for [facility] services for current pregnancy and aware of Breastfeeding Peer Counselor Support Program. Desires NIPS and carrier screening test today. Left arm BP = 36.5 cm and needs large adult BP cuff. Burnadette Lowers, RN Hgb = 11.9 and no intervention required per standing order. Aspirin  dispensed as ordered and counseled to take daily beginning 09/08/24 when EGA = 12 0/7 weeks. Client verbalized understanding. Burnadette Lowers, RN [facility] referral form received from provider and faxed with confirmation received. Burnadette Lowers, RN

## 2024-09-01 LAB — PROTEIN / CREATININE RATIO, URINE
Creatinine, Urine: 185.8 mg/dL
Protein, Ur: 11.7 mg/dL
Protein/Creat Ratio: 63 mg/g{creat} (ref 0–200)

## 2024-09-02 DIAGNOSIS — R0602 Shortness of breath: Secondary | ICD-10-CM | POA: Insufficient documentation

## 2024-09-02 NOTE — Progress Notes (Unsigned)
 Cardiology Office Note  Date:  09/03/2024   ID:  Nicole Rocha, DOB [birth year], MRN [id number]  PCP:  Patient, No Pcp Per   Chief Complaint  Patient presents with   New Patient (Initial Visit)    Patient was at [facility] ER with shortness of breath. Patient c/o shortness of breath & dizziness at times.     HPI:  Nicole Rocha is a 23 y.o. female with past medical history of: Past Medical History:  Diagnosis Date   Abnormal Pap smear of cervix 11/26/20 LSIL 03/11/2021   Acute appendicitis 09/14/2021   Anemia    Shortness of breath    Occurs intermittently, has cardiology appt 09/03/24   Spontaneous abortion    2021 x2, 2023      (medical evaluation x1)   Supervision of other normal pregnancy, antepartum 11/26/2020              Nursing Staff    Provider      Office Location     [facility]    Dating     LMP      Language     Spanish    Anatomy US      01/15/21- Normal anatomy with measurements consistent with dates. Suboptimal views of the fetal anatomy were obtained   secondary to fetal position. She declined genetic screening today.   Given the suboptimal views of the fetal antomy we have   scheduled to return in 4-6    Vapes nicotine containing substance 09/08/2022  Who presents by referral from Tinnie Fly and Thersia Pouch for right bundle branch block, shortness of breath  In the emergency room August 12, 2024 with shortness of breath, chest tightness,pressure [redacted] weeks pregnant at the time She does use vape Workup in the ER negative  Reports similar symptoms with prior pregnancy at 6 months pregnancy  Seen in the ER July 2020 for chest pain Negative workup Received Toradol  at that time  Shortness of breath symptoms in the past 2 to 3 weeks since the ER have been off and on Unclear weight gain since beginning of pregnancy  Labs: HGB11.9 D-dimer: 0.6 Troponin neg x 2 Potassium 3.4  EKG personally reviewed by myself on todays visit EKG  Interpretation Date/Time:  Monday September 03 2024 10:31:52 EST Ventricular Rate:  77 PR Interval:  146 QRS Duration:  126 QT Interval:  394 QTC Calculation: 445 R Axis:   44  Text Interpretation: Normal sinus rhythm Right bundle branch block When compared with ECG of 12-Aug-2024 13:58, No significant change was found Confirmed by Perla Lye [phone]) on 09/03/2024 10:41:56 AM    PMH:   has a past medical history of Abnormal Pap smear of cervix 11/26/20 LSIL (03/11/2021), Acute appendicitis (09/14/2021), Anemia, Shortness of breath, Spontaneous abortion, Supervision of other normal pregnancy, antepartum (11/26/2020), and Vapes nicotine containing substance (09/08/2022).   PSH:    Past Surgical History:  Procedure Laterality Date   XI ROBOTIC LAPAROSCOPIC ASSISTED APPENDECTOMY N/A 09/14/2021   Procedure: XI ROBOTIC LAPAROSCOPIC ASSISTED APPENDECTOMY;  Surgeon: Tye Millet, DO;  Location: [facility] ORS;  Service: General;  Laterality: N/A;    Current Outpatient Medications  Medication Sig Dispense Refill   ondansetron  (ZOFRAN -ODT) 4 MG disintegrating tablet Take 1 tablet (4 mg total) by mouth every 8 (eight) hours as needed for nausea or vomiting. 15 tablet 0   Prenatal Vit-Fe Fumarate-FA (MULTIVITAMIN-PRENATAL) 27-0.8 MG TABS tablet Take 1 tablet by mouth daily at 12 noon.     aspirin  EC 81 MG  tablet Take 1 tablet (81 mg total) by mouth daily. Swallow whole. (Patient not taking: Reported on 09/03/2024)     No current facility-administered medications for this visit.     Allergies:   Patient has no known allergies.   Social History:  The patient  reports that she has quit smoking. Her smoking use included cigarettes and e-cigarettes. She has never been exposed to tobacco smoke. She has never used smokeless tobacco. She reports that she does not currently use alcohol. She reports that she does not currently use drugs after having used the following drugs: Marijuana.   Family History:    family history includes Healthy in her brother, brother, daughter, father, maternal grandfather, maternal grandmother, mother, paternal grandfather, and paternal grandmother.    Review of Systems: Review of Systems  Constitutional: Negative.   HENT: Negative.    Respiratory:  Positive for shortness of breath.   Cardiovascular: Negative.   Gastrointestinal: Negative.   Musculoskeletal: Negative.   Neurological: Negative.   Psychiatric/Behavioral: Negative.    All other systems reviewed and are negative.   PHYSICAL EXAM: VS:  BP 98/64 (BP Location: Right Arm, Patient Position: Sitting, Cuff Size: Normal)   Pulse 77   Ht 5' 7 (1.702 m)   Wt 198 lb 2 oz (89.9 kg)   LMP 05/04/2024 (Approximate)   SpO2 99%   BMI 31.03 kg/m  , BMI Body mass index is 31.03 kg/m. GEN: Well nourished, well developed, in no acute distress HEENT: normal Neck: no JVD, carotid bruits, or masses Cardiac: RRR; no murmurs, rubs, or gallops,no edema  Respiratory:  clear to auscultation bilaterally, normal work of breathing GI: soft, nontender, nondistended, + BS MS: no deformity or atrophy Skin: warm and dry, no rash Neuro:  Strength and sensation are intact Psych: euthymic mood, full affect   Recent Labs: 08/12/2024: BUN 13; Creatinine, Ser 0.54; Hemoglobin 12.3; Platelets 307; Potassium 3.4; Sodium 135    Lipid Panel No results found for: CHOL, HDL, LDLCALC, TRIG    Wt Readings from Last 3 Encounters:  09/03/24 198 lb 2 oz (89.9 kg)  08/31/24 199 lb 6.4 oz (90.4 kg)  08/12/24 210 lb (95.3 kg)       ASSESSMENT AND PLAN:  Problem List Items Addressed This Visit       Cardiology Problems   Bundle branch block   Relevant Orders   EKG 12-Lead (Completed)     Other   Shortness of breath - Primary   Relevant Orders   EKG 12-Lead (Completed)   Obesity BMI=32.4   Shortness of breath Etiology unclear, appears euvolemic, blood work normal, blood pressure low Likely unrelated to  right bundle branch block on EKG - Reports similar symptoms at 6 months in first pregnancy - Recommend echocardiogram for further evaluation  Abnormal EKG/right bundle branch block Likely benign finding Noted on EKG May 2022 Echocardiogram ordered for baseline study  Obesity May be contributing to shortness of breath at times We have encouraged continued exercise, careful diet management      Signed, Velinda Lunger, M.D., Ph.D. [facility] [place], [place] [phone]

## 2024-09-03 ENCOUNTER — Ambulatory Visit: Payer: Self-pay | Attending: Cardiovascular Disease | Admitting: Cardiovascular Disease

## 2024-09-03 ENCOUNTER — Encounter: Payer: Self-pay | Admitting: Cardiovascular Disease

## 2024-09-03 VITALS — BP 98/64 | HR 77 | Ht 67.0 in | Wt 198.1 lb

## 2024-09-03 DIAGNOSIS — E669 Obesity, unspecified: Secondary | ICD-10-CM

## 2024-09-03 DIAGNOSIS — R0602 Shortness of breath: Secondary | ICD-10-CM

## 2024-09-03 DIAGNOSIS — I454 Nonspecific intraventricular block: Secondary | ICD-10-CM

## 2024-09-03 NOTE — Progress Notes (Signed)
 Baseline urine P:C normal.  Hgb normal.  Chart updated. To be discussed with patient at next prenatal appointment.    Dorothyann Helling, MD 09/03/24  3:26 PM

## 2024-09-03 NOTE — Patient Instructions (Addendum)

## 2024-09-04 ENCOUNTER — Telehealth: Payer: Self-pay

## 2024-09-04 NOTE — Telephone Encounter (Signed)
 Call to [facility] M[facility] Scheduler to schedule MFM consult appt. Tatiana aware client kept 09/03/24 cardiology appt and has 10/10/24 echocardiogram appt. Call placed on brief hold while [facility] consulted with provider. When returned to phone, Kathern stated per Dr. Delora Smaller as long as client seeing her cardiologist, MFM appt not needed until usual 19 - 20 weeks. Hardin Pouch NP sent staff message regarding above. Burnadette Lowers, RN

## 2024-09-12 ENCOUNTER — Telehealth: Payer: Self-pay

## 2024-09-12 LAB — PREGNANCY, INITIAL SCREEN
Antibody Screen: NEGATIVE
Basophils Absolute: 0 x10E3/uL (ref 0.0–0.2)
Basos: 0 %
Bilirubin, UA: NEGATIVE
Chlamydia trachomatis, NAA: POSITIVE — AB
EOS (ABSOLUTE): 0 x10E3/uL (ref 0.0–0.4)
Eos: 0 %
Glucose, UA: NEGATIVE
HCV Ab: NONREACTIVE
HIV Screen 4th Generation wRfx: NONREACTIVE
Hematocrit: 36 % (ref 34.0–46.6)
Hemoglobin: 11.9 g/dL (ref 11.1–15.9)
Hepatitis B Surface Ag: NEGATIVE
Immature Grans (Abs): 0 x10E3/uL (ref 0.0–0.1)
Immature Granulocytes: 0 %
Ketones, UA: NEGATIVE
Lymphocytes Absolute: 1.2 x10E3/uL (ref 0.7–3.1)
Lymphs: 17 %
MCH: 29.2 pg (ref 26.6–33.0)
MCHC: 33.1 g/dL (ref 31.5–35.7)
MCV: 88 fL (ref 79–97)
Monocytes Absolute: 0.2 x10E3/uL (ref 0.1–0.9)
Monocytes: 3 %
Neisseria Gonorrhoeae by PCR: NEGATIVE
Neutrophils Absolute: 5.4 x10E3/uL (ref 1.4–7.0)
Neutrophils: 80 %
Nitrite, UA: NEGATIVE
Platelets: 292 x10E3/uL (ref 150–450)
Protein,UA: NEGATIVE
RBC, UA: NEGATIVE
RBC: 4.08 x10E6/uL (ref 3.77–5.28)
RDW: 13.2 % (ref 11.7–15.4)
RPR Ser Ql: NONREACTIVE
Rh Factor: POSITIVE
Rubella Antibodies, IGG: 5.64 {index} (ref >0.99)
Specific Gravity, UA: 1.025 (ref 1.005–1.030)
Urobilinogen, Ur: 1 mg/dL (ref 0.2–1.0)
WBC: 6.8 x10E3/uL (ref 3.4–10.8)
pH, UA: 6 (ref 5.0–7.5)

## 2024-09-12 LAB — COMPREHENSIVE METABOLIC PANEL WITH GFR
ALT: 13 IU/L (ref 0–32)
AST: 13 IU/L (ref 0–40)
Albumin: 4.4 g/dL (ref 4.0–5.0)
Alkaline Phosphatase: 80 IU/L (ref 41–116)
BUN/Creatinine Ratio: 20 (ref 9–23)
BUN: 9 mg/dL (ref 6–20)
Bilirubin Total: 0.2 mg/dL (ref 0.0–1.2)
CO2: 23 mmol/L (ref 20–29)
Calcium: 9.5 mg/dL (ref 8.7–10.2)
Chloride: 97 mmol/L (ref 96–106)
Creatinine, Ser: 0.46 mg/dL — ABNORMAL LOW (ref 0.57–1.00)
Globulin, Total: 2.7 g/dL (ref 1.5–4.5)
Glucose: 161 mg/dL — ABNORMAL HIGH (ref 70–99)
Potassium: 3.9 mmol/L (ref 3.5–5.2)
Sodium: 134 mmol/L (ref 134–144)
Total Protein: 7.1 g/dL (ref 6.0–8.5)
eGFR: 136 mL/min/1.73 (ref >59)

## 2024-09-12 LAB — MATERNIT21  PLUS CORE+ESS+SCA, BLOOD
11q23 deletion (Jacobsen): NOT DETECTED
15q11 deletion (PW Angelman): NOT DETECTED
1p36 deletion syndrome: NOT DETECTED
22q11 deletion (DiGeorge): NOT DETECTED
4p16 deletion(Wolf-Hirschhorn): NOT DETECTED
5p15 deletion (Cri-du-chat): NOT DETECTED
8q24 deletion (Langer-Giedion): NOT DETECTED
Fetal Fraction: 20
Monosomy X (Turner Syndrome): NOT DETECTED
Result (T21): NEGATIVE
Trisomy 13 (Patau syndrome): NEGATIVE
Trisomy 16: NOT DETECTED
Trisomy 18 (Edwards syndrome): NEGATIVE
Trisomy 21 (Down syndrome): NEGATIVE
Trisomy 22: NOT DETECTED
XXX (Triple X Syndrome): NOT DETECTED
XXY (Klinefelter Syndrome): NOT DETECTED
XYY (Jacobs Syndrome): NOT DETECTED

## 2024-09-12 LAB — MICROSCOPIC EXAMINATION
Bacteria, UA: NONE SEEN
Casts: NONE SEEN /LPF

## 2024-09-12 LAB — BEACON CARRIER SCREEN;14 GENES

## 2024-09-12 LAB — HCV INTERPRETATION

## 2024-09-12 LAB — TSH: TSH: 0.281 u[IU]/mL — ABNORMAL LOW (ref 0.450–4.500)

## 2024-09-12 LAB — URINE CULTURE, OB REFLEX

## 2024-09-12 LAB — GLUCOSE, 1 HOUR GESTATIONAL: Gestational Diabetes Screen: 165 mg/dL — ABNORMAL HIGH (ref 70–139)

## 2024-09-12 LAB — HGB A1C W/O EAG: Hgb A1c MFr Bld: 5.3 % (ref 4.8–5.6)

## 2024-09-12 NOTE — Telephone Encounter (Signed)
 TC to client to inform her that she needs and appointment this week to come in for treatment due to one of her test results. (Chlamydia positive). Patient scheduled for 09/14/24 for OB problem visit. Interpreter, Gladies Roche.Hulan Parish, RN

## 2024-09-13 DIAGNOSIS — O99810 Abnormal glucose complicating pregnancy: Secondary | ICD-10-CM | POA: Insufficient documentation

## 2024-09-13 DIAGNOSIS — A749 Chlamydial infection, unspecified: Secondary | ICD-10-CM | POA: Insufficient documentation

## 2024-09-13 NOTE — Progress Notes (Signed)
 Early 1 ht GTT elevated - needs 3 hr GTT. A1c wnl.  (+) chlamydia - needs azithromycin 1g asap.  TSH decreased but within expected range for first trimester. No further actions needed.   Otherwise, labs unremarkable. Chart updated.  Dorothyann Helling, MD 09/13/24  10:39 AM

## 2024-09-13 NOTE — Addendum Note (Signed)
 Addended by: Nikolas Casher on: 09/13/2024 01:49 PM   Modules accepted: Orders

## 2024-09-13 NOTE — Telephone Encounter (Signed)
 Call to client and counseled on 1 hour GTT lab result and need for 3 hour GTT. Test prep instructions reviewed and client verbalized understanding. She also confirmed 3 hour GTT appt day of 09/19/24 (Wednesday). Chlamydia treatment and 3 hour GTT appt not scheduled at same time due to fasting status required for GTT. Burnadette Lowers, RN

## 2024-09-14 ENCOUNTER — Ambulatory Visit: Payer: Self-pay | Admitting: Family Medicine

## 2024-09-14 DIAGNOSIS — O0990 Supervision of high risk pregnancy, unspecified, unspecified trimester: Secondary | ICD-10-CM

## 2024-09-14 DIAGNOSIS — O0991 Supervision of high risk pregnancy, unspecified, first trimester: Secondary | ICD-10-CM

## 2024-09-14 DIAGNOSIS — O98811 Other maternal infectious and parasitic diseases complicating pregnancy, first trimester: Secondary | ICD-10-CM

## 2024-09-14 DIAGNOSIS — A749 Chlamydial infection, unspecified: Secondary | ICD-10-CM

## 2024-09-14 MED ORDER — AZITHROMYCIN 500 MG PO TABS
1000.0000 mg | ORAL_TABLET | Freq: Once | ORAL | Status: AC
  Administered 2024-09-14: 1000 mg via ORAL

## 2024-09-14 NOTE — Progress Notes (Signed)
 Per client, began taking daily Aspirin  as ordered at EGA = 12 weeks. Aware of 09/19/24 3 hour GTT appt and correctly verbalizes test prep instructions. Reminder card given. Aware of 09/28/24 [facility] RV appt and follow up given. Kept 09/03/24 cardiology appt. Aware of 10/10/24 ECHO appt. Treated for chlamydia per standing orders. Per client, she plans to schedule appt for partner in STI clinic today. Burnadette Lowers, RN Discussed CenteringPregnancy Program with client and unable to participate due to childcare. Burnadette Lowers, RN

## 2024-09-19 ENCOUNTER — Other Ambulatory Visit: Payer: Self-pay

## 2024-09-19 DIAGNOSIS — O99810 Abnormal glucose complicating pregnancy: Secondary | ICD-10-CM

## 2024-09-19 NOTE — Progress Notes (Signed)
 In nurse clinic for 3 hr GTT.  LILLETTE Roche, interpreter Reports npo since 7 pm last night. Instructions reviewed for test today. Verbalizes understanding. RN walked patient to lab. Topanga Alvelo, RN

## 2024-09-20 ENCOUNTER — Ambulatory Visit: Payer: Self-pay | Admitting: Family Medicine

## 2024-09-20 DIAGNOSIS — O99810 Abnormal glucose complicating pregnancy: Secondary | ICD-10-CM

## 2024-09-20 DIAGNOSIS — O0990 Supervision of high risk pregnancy, unspecified, unspecified trimester: Secondary | ICD-10-CM

## 2024-09-20 LAB — GESTATIONAL GLUCOSE TOLERANCE
Glucose, Fasting: 82 mg/dL (ref 70–94)
Glucose, GTT - 1 Hour: 146 mg/dL (ref 70–179)
Glucose, GTT - 2 Hour: 104 mg/dL (ref 70–154)
Glucose, GTT - 3 Hour: 80 mg/dL (ref 70–139)

## 2024-09-20 NOTE — Progress Notes (Signed)
 Negative early 3 hr GTT. No signs of pre-existing diabetes. Will need 3 hr GTT 24-28 weeks for GDM screen. Chart updated. To be discussed with patient at next prenatal appointment.   Dorothyann Helling, MD 09/20/2024  12:24 PM

## 2024-09-28 ENCOUNTER — Ambulatory Visit: Payer: Self-pay | Admitting: Nurse Practitioner

## 2024-09-28 VITALS — BP 103/66 | HR 89 | Temp 97.3°F | Wt 198.6 lb

## 2024-09-28 DIAGNOSIS — Z3A14 14 weeks gestation of pregnancy: Secondary | ICD-10-CM

## 2024-09-28 DIAGNOSIS — O99212 Obesity complicating pregnancy, second trimester: Secondary | ICD-10-CM

## 2024-09-28 DIAGNOSIS — I454 Nonspecific intraventricular block: Secondary | ICD-10-CM

## 2024-09-28 DIAGNOSIS — A749 Chlamydial infection, unspecified: Secondary | ICD-10-CM

## 2024-09-28 DIAGNOSIS — O0992 Supervision of high risk pregnancy, unspecified, second trimester: Secondary | ICD-10-CM

## 2024-09-28 DIAGNOSIS — O98811 Other maternal infectious and parasitic diseases complicating pregnancy, first trimester: Secondary | ICD-10-CM

## 2024-09-28 DIAGNOSIS — O99210 Obesity complicating pregnancy, unspecified trimester: Secondary | ICD-10-CM

## 2024-09-28 DIAGNOSIS — O99810 Abnormal glucose complicating pregnancy: Secondary | ICD-10-CM

## 2024-09-28 DIAGNOSIS — O0990 Supervision of high risk pregnancy, unspecified, unspecified trimester: Secondary | ICD-10-CM

## 2024-09-28 DIAGNOSIS — O209 Hemorrhage in early pregnancy, unspecified: Secondary | ICD-10-CM

## 2024-09-28 DIAGNOSIS — R0602 Shortness of breath: Secondary | ICD-10-CM

## 2024-09-28 NOTE — Progress Notes (Signed)
 " [facility] [address] [place] [address] Main phone: [phone]  Prenatal Visit  Subjective:  Nicole Rocha is a 23 y.o. [phone] at [redacted]w[redacted]d being seen today for ongoing prenatal care.  She is currently monitored for the following issues for this high-risk pregnancy:   Patient Active Problem List   Diagnosis Date Noted   Abnormal glucose affecting pregnancy 09/13/2024   Chlamydia infection affecting pregnancy in first trimester 09/13/2024   Shortness of breath 09/02/2024   Supervision of high risk pregnancy, antepartum 08/31/2024   Obesity in pregnancy= Pregravid BMI: 36.5 08/31/2024   Bundle branch block 08/31/2024   Vaginal bleeding in pregnancy, first trimester 08/31/2024   HPI Patient reports no complaints.  Contractions: Not present. Vag. Bleeding: None.  Movement: Absent. Denies leaking of fluid/ROM.   The following portions of the patient's history were reviewed and updated as appropriate: allergies, current medications, past family history, past medical history, past social history, past surgical history and problem list. Problem list updated.  Objective:   Vitals:   09/28/24 1529  BP: 103/66  Pulse: 89  Temp: (!) 97.3 F (36.3 C)  Weight: 198 lb 9.6 oz (90.1 kg)   Total weight gain from pre-pregnancy weight: -11 lb 6.4 oz (-5.171 kg)  Fetal Status: Fetal Heart Rate (bpm): 150   Movement: Absent     General:  Alert, oriented and cooperative. Patient is in no acute distress.  Skin: Skin is warm and dry. No rash noted.   Cardiovascular: Normal heart rate noted  Respiratory: Normal respiratory effort, no problems with respiration noted  Abdomen: Soft, gravid, appropriate for gestational age.  Pain/Pressure: Absent     Pelvic: Cervical exam deferred        Extremities: Normal range of motion.     Mental Status: Normal mood and affect. Normal behavior. Normal judgment and thought  content.   Assessment and Plan:  Pregnancy: G5P1031 at [redacted]w[redacted]d  1. [redacted] weeks pregnant  2. Supervision of high risk pregnancy, antepartum (Primary)  - Doing well, no complaints  - Anatomy US  ordered today  3. Obesity in pregnancy= Pregravid BMI: 36.5  - TWG -11 lbs - Eating well overall, was vomiting earlier in pregnancy but not anymore.  - Taking aspirin  daily  4. Chlamydia infection affecting pregnancy in first trimester  - Treated on 09/14/24 - Partner treated on 09/19/24, no sex since her treatment. Advised her to abstain from sex until 10/03/24. - Next TOC around 10/05/24  5. Abnormal glucose affecting pregnancy  - Elevated 1 hour, normal 3 hour - Repeat 3 hour GTT around 28 weeks - Discussed daily exercise, walking after meals   6. Shortness of breath  - Has been better lately, endorses SOB sometimes   7. Vaginal bleeding in pregnancy, first trimester  - Denies vaginal bleeding since ED visit on 07/29/24  8. Bundle branch block  - Went to ED with shortness of breath and chest pressure on 08/12/24, bundle branch block noted on EKG - Seen by cardiology on 09/08/24, Echo ordered, scheduled for 10/10/24  Preterm labor symptoms and general obstetric precautions including but not limited to vaginal bleeding, contractions, leaking of fluid and fetal movement were reviewed in detail with the patient. Please refer to After Visit Summary for other counseling recommendations.   Due to language barrier, a Spanish interpreter Emmie SAILOR.) was present in person during the history-taking, subsequent discussion, and physical exam with this patient.    Return in about  4 weeks (around 10/26/2024).  Future Appointments  Date Time Provider Department Center  10/10/2024  4:00 PM [facility] US  3 [facility] None  10/26/2024  2:00 PM [facility] PROVIDER [facility] None   Damien FORBES Satchel, NP "

## 2024-10-01 ENCOUNTER — Other Ambulatory Visit: Payer: Self-pay | Admitting: Family Medicine

## 2024-10-01 DIAGNOSIS — O0990 Supervision of high risk pregnancy, unspecified, unspecified trimester: Secondary | ICD-10-CM

## 2024-10-01 NOTE — Progress Notes (Signed)
 1. Supervision of high risk pregnancy, antepartum (Primary) -per radiology, changed order to detailed US  for anatomy scan  - US  MFM OB DETAIL +14 WK; Future  [facility] FNP-C

## 2024-10-10 ENCOUNTER — Ambulatory Visit: Payer: Self-pay

## 2024-10-26 ENCOUNTER — Ambulatory Visit: Payer: Self-pay

## 2024-10-26 DIAGNOSIS — Z3A18 18 weeks gestation of pregnancy: Secondary | ICD-10-CM

## 2024-10-26 DIAGNOSIS — O0990 Supervision of high risk pregnancy, unspecified, unspecified trimester: Secondary | ICD-10-CM

## 2024-10-26 DIAGNOSIS — O99810 Abnormal glucose complicating pregnancy: Secondary | ICD-10-CM

## 2024-10-26 DIAGNOSIS — O99210 Obesity complicating pregnancy, unspecified trimester: Secondary | ICD-10-CM

## 2024-10-26 DIAGNOSIS — I454 Nonspecific intraventricular block: Secondary | ICD-10-CM

## 2024-10-26 DIAGNOSIS — A749 Chlamydial infection, unspecified: Secondary | ICD-10-CM

## 2024-10-26 DIAGNOSIS — R0602 Shortness of breath: Secondary | ICD-10-CM

## 2024-11-02 ENCOUNTER — Ambulatory Visit: Payer: Self-pay

## 2024-11-02 VITALS — BP 108/67 | HR 93 | Temp 97.6°F | Wt 206.2 lb

## 2024-11-02 DIAGNOSIS — O0990 Supervision of high risk pregnancy, unspecified, unspecified trimester: Secondary | ICD-10-CM

## 2024-11-02 DIAGNOSIS — A749 Chlamydial infection, unspecified: Secondary | ICD-10-CM

## 2024-11-02 DIAGNOSIS — O99210 Obesity complicating pregnancy, unspecified trimester: Secondary | ICD-10-CM

## 2024-11-02 DIAGNOSIS — R0602 Shortness of breath: Secondary | ICD-10-CM

## 2024-11-02 DIAGNOSIS — Z3A19 19 weeks gestation of pregnancy: Secondary | ICD-10-CM

## 2024-11-02 DIAGNOSIS — O98812 Other maternal infectious and parasitic diseases complicating pregnancy, second trimester: Secondary | ICD-10-CM

## 2024-11-02 DIAGNOSIS — O99212 Obesity complicating pregnancy, second trimester: Secondary | ICD-10-CM

## 2024-11-02 DIAGNOSIS — O0992 Supervision of high risk pregnancy, unspecified, second trimester: Secondary | ICD-10-CM

## 2024-11-02 NOTE — Progress Notes (Signed)
 " [facility] [address] [place] [address] Main phone: [phone]  Prenatal Visit  Subjective:  Nicole Rocha is a 24 y.o. [phone] at [redacted]w[redacted]d being seen today for ongoing prenatal care.  She is currently monitored for the following issues for this high-risk pregnancy:   Patient Active Problem List   Diagnosis Date Noted   Abnormal glucose affecting pregnancy 09/13/2024   Chlamydia infection affecting pregnancy in first trimester 09/13/2024   Shortness of breath 09/02/2024   Supervision of high risk pregnancy, antepartum 08/31/2024   Obesity in pregnancy= Pregravid BMI: 36.5 08/31/2024   Bundle branch block 08/31/2024   Vaginal bleeding in pregnancy, first trimester 08/31/2024   HPI Patient reports no complaints.  Contractions: Not present. Vag. Bleeding: None.  Movement: Present. Pt Denies leaking of fluid/ROM.   The following portions of the patient's history were reviewed and updated as appropriate: allergies, current medications, past family history, past medical history, past social history, past surgical history and problem list. Problem list updated.  Objective:   Vitals:   11/02/24 1043  BP: 108/67  Pulse: 93  Temp: 97.6 F (36.4 C)  Weight: 206 lb 3.2 oz (93.5 kg)   Total weight gain from pre-pregnancy weight: -3 lb 12.8 oz (-1.724 kg)  Fetal Status: Fetal Heart Rate (bpm): 157 Fundal Height: 20 cm Movement: Present    Fundal height trends reviewed - appropriate for EGA  General:  Alert, oriented and cooperative. Patient is in no acute distress.  Skin: Skin is warm and dry. No rash noted.   Cardiovascular: Normal heart rate noted  Respiratory: Normal respiratory effort, no problems with respiration noted  Abdomen: Soft, gravid, appropriate for gestational age.  Pain/Pressure: Absent     Pelvic: Cervical exam deferred        Extremities: Normal range of motion.  Edema: None  Mental  Status: Normal mood and affect. Normal behavior. Normal judgment and thought content.   Assessment and Plan:  Pregnancy: G5P1031 at [redacted]w[redacted]d  1. [redacted] weeks gestation of pregnancy (Primary) -RV in 4 weeks.  2. Supervision of high risk pregnancy, antepartum  -Continues to take PNV without concerns.   -Had cardiology appt on 09/03/24, CARDS recommended echo pt didn't show up for the appt because she feels fine. Discussed importance of f/u echo per cardiologist recommendations. Pt will call to reschedule appt for echo evaluation.  -Enc pt to keep US  and MFM appt on 11/12/24. -Fetal movement noted, fundal height appropriate for GA. No concerns at visit today.   3. Obesity in pregnancy= Pregravid BMI: 36.5 -TWG: -3 lb 12.8 oz (-1.724 kg) Has gained 8 lbs in 6 weeks.   -Continues to take LDASA.  4. Shortness of breath -Pt has not had any episodes of shortness of breath.   5. Chlamydia infection affecting pregnancy in first trimester -TOC conducted today, pt self-swabbed. Will notify pt of results accordingly.  - Chlamydia/GC NAA, Confirmation    Preterm labor symptoms and general obstetric precautions including but not limited to vaginal bleeding, contractions, leaking of fluid and fetal movement were reviewed in detail with the patient. Please refer to After Visit Summary for other counseling recommendations.  Return in about 4 weeks (around 11/30/2024) for routine prenatal care.  Future Appointments  Date Time Provider Department Center  11/12/2024 12:45 PM [facility] PROVIDER 1 [facility] [place]  11/12/2024  1:00 PM [facility] [place] [facility] US  1 [facility] [place]  11/30/2024  2:00 PM [facility] PROVIDER [facility] None  Hardin GORMAN Pouch, NP  Due to language barrier, a Spanish interpreter Emmie SAILOR.) was present in person during the history-taking, subsequent discussion, and physical exam with this patient.    "

## 2024-11-02 NOTE — Progress Notes (Signed)
 Here for MH RV at 19 6/7. Declines AFP today.Hulan Parish, RN

## 2024-11-03 LAB — CHLAMYDIA/GC NAA, CONFIRMATION
Chlamydia trachomatis, NAA: NEGATIVE
Neisseria gonorrhoeae, NAA: NEGATIVE

## 2024-11-12 ENCOUNTER — Other Ambulatory Visit: Payer: Self-pay

## 2024-11-30 ENCOUNTER — Ambulatory Visit
